# Patient Record
Sex: Female | Born: 1955
Health system: Southern US, Community
[De-identification: ages and names within clinical notes are randomized; demographics above are authoritative.]

## PROBLEM LIST (undated history)

## (undated) DIAGNOSIS — M1990 Unspecified osteoarthritis, unspecified site: Secondary | ICD-10-CM

## (undated) DIAGNOSIS — H269 Unspecified cataract: Secondary | ICD-10-CM

## (undated) DIAGNOSIS — R12 Heartburn: Secondary | ICD-10-CM

## (undated) DIAGNOSIS — K219 Gastro-esophageal reflux disease without esophagitis: Secondary | ICD-10-CM

## (undated) HISTORY — DX: Unspecified cataract: H26.9

## (undated) HISTORY — DX: Gastro-esophageal reflux disease without esophagitis: K21.9

## (undated) HISTORY — DX: Unspecified osteoarthritis, unspecified site: M19.90

## (undated) HISTORY — DX: Heartburn: R12

---

## 1975-08-02 HISTORY — PX: CYSTECTOMY: SUR359

## 2016-08-03 DIAGNOSIS — Z0189 Encounter for other specified special examinations: Secondary | ICD-10-CM | POA: Diagnosis not present

## 2016-08-03 DIAGNOSIS — J302 Other seasonal allergic rhinitis: Secondary | ICD-10-CM | POA: Diagnosis not present

## 2016-08-03 DIAGNOSIS — Z6832 Body mass index (BMI) 32.0-32.9, adult: Secondary | ICD-10-CM | POA: Diagnosis not present

## 2016-08-04 DIAGNOSIS — Z08 Encounter for follow-up examination after completed treatment for malignant neoplasm: Secondary | ICD-10-CM | POA: Diagnosis not present

## 2016-08-04 DIAGNOSIS — L570 Actinic keratosis: Secondary | ICD-10-CM | POA: Diagnosis not present

## 2016-08-04 DIAGNOSIS — X32XXXA Exposure to sunlight, initial encounter: Secondary | ICD-10-CM | POA: Diagnosis not present

## 2016-08-04 DIAGNOSIS — Z85828 Personal history of other malignant neoplasm of skin: Secondary | ICD-10-CM | POA: Diagnosis not present

## 2016-08-15 DIAGNOSIS — C44619 Basal cell carcinoma of skin of left upper limb, including shoulder: Secondary | ICD-10-CM | POA: Diagnosis not present

## 2016-08-15 DIAGNOSIS — Z1159 Encounter for screening for other viral diseases: Secondary | ICD-10-CM | POA: Diagnosis not present

## 2016-08-15 DIAGNOSIS — J302 Other seasonal allergic rhinitis: Secondary | ICD-10-CM | POA: Diagnosis not present

## 2016-08-15 DIAGNOSIS — Z0000 Encounter for general adult medical examination without abnormal findings: Secondary | ICD-10-CM | POA: Diagnosis not present

## 2016-08-15 DIAGNOSIS — M8580 Other specified disorders of bone density and structure, unspecified site: Secondary | ICD-10-CM | POA: Diagnosis not present

## 2016-09-13 ENCOUNTER — Ambulatory Visit (INDEPENDENT_AMBULATORY_CARE_PROVIDER_SITE_OTHER): Payer: BLUE CROSS/BLUE SHIELD

## 2016-09-13 ENCOUNTER — Ambulatory Visit (INDEPENDENT_AMBULATORY_CARE_PROVIDER_SITE_OTHER): Payer: BLUE CROSS/BLUE SHIELD | Admitting: Orthopaedic Surgery

## 2016-09-13 ENCOUNTER — Encounter: Payer: Self-pay | Admitting: Orthopaedic Surgery

## 2016-09-13 VITALS — BP 126/79 | HR 94 | Temp 97.9°F | Ht 63.0 in | Wt 181.0 lb

## 2016-09-13 DIAGNOSIS — M20011 Mallet finger of right finger(s): Secondary | ICD-10-CM

## 2016-09-13 NOTE — Progress Notes (Signed)
Subjective:    Patient ID: Heather Ali, female    DOB: 12-Jan-1956, 60 y.o.   MRN: [id number]  HPI She hurt her right long finger at the DIP joint about 10 weeks ago while removing stockings on the right foot.  She had pain.  She looked it up on Google and felt she had a mallet finger as she had a mallet deformity.  She ordered a splint on [employer] and had kept the finger in the splint for 8 weeks as the article on Google suggested.  She saw Dr. Delphina Cahill the other week and he suggested she come here.  She has no pain but the finger does not fully extend.   Review of Systems  HENT: Negative for congestion.   Respiratory: Negative for cough and shortness of breath.   Cardiovascular: Negative for chest pain and leg swelling.  Endocrine: Negative for cold intolerance.  Musculoskeletal: Positive for arthralgias and joint swelling.  Allergic/Immunologic: Negative for environmental allergies.   History reviewed. No pertinent past medical history.  Past Surgical History:  Procedure Laterality Date  . CYSTECTOMY  1977   OVARY    No current outpatient prescriptions on file prior to visit.   No current facility-administered medications on file prior to visit.     Social History   Social History  . Marital status: Married    Spouse name: N/A  . Number of children: N/A  . Years of education: N/A   Occupational History  . Not on file.   Social History Main Topics  . Smoking status: Never Smoker  . Smokeless tobacco: Never Used  . Alcohol use Not on file  . Drug use: Unknown  . Sexual activity: Not on file   Other Topics Concern  . Not on file   Social History Narrative  . No narrative on file    Family History  Problem Relation Age of Onset  . Hyperlipidemia Mother   . Diabetes Father   . Hypertension Father     BP 126/79   Pulse 94   Temp 97.9 F (36.6 C)   Ht 5\' 3"  (1.6 m)   Wt 181 lb (82.1 kg)   BMI 32.06 kg/m       Objective:   Physical Exam   Constitutional: She is oriented to person, place, and time. She appears well-developed and well-nourished.  HENT:  Head: Normocephalic and atraumatic.  Eyes: Conjunctivae and EOM are normal. Pupils are equal, round, and reactive to light.  Neck: Normal range of motion. Neck supple.  Cardiovascular: Normal rate, regular rhythm and intact distal pulses.   Pulmonary/Chest: Effort normal.  Abdominal: Soft.  Musculoskeletal: She exhibits tenderness (She has slight swelling of the DIP of the right long finger with about 10 to 12 degree lack of full extension of the finger joint.  NV intact.  Other fingers negative.).  Neurological: She is alert and oriented to person, place, and time. She displays normal reflexes. No cranial nerve deficit. She exhibits normal muscle tone. Coordination normal.  Skin: Skin is warm and dry.  Psychiatric: She has a normal mood and affect. Her behavior is normal. Judgment and thought content normal.  Vitals reviewed.   X-rays were done of the finger, reported separately.  There is no bony change.      Assessment & Plan:   Encounter Diagnosis  Name Primary?  . Mallet deformity of right middle finger Yes   I have gone over with her the findings and the diagnosis of  Mallet Finger.  She has done the proper therapy.  She will have an extension lag.  I have offered her to see a Copy.  I have told her about possible surgery.  She says she is doing well.  She will accept the lag.    I will see her as needed.  Call if any problem.  Electronically Signed Sanjuana Kava, MD 2/13/201812:04 PM

## 2017-03-01 DIAGNOSIS — N390 Urinary tract infection, site not specified: Secondary | ICD-10-CM | POA: Diagnosis not present

## 2017-03-01 DIAGNOSIS — Z6832 Body mass index (BMI) 32.0-32.9, adult: Secondary | ICD-10-CM | POA: Diagnosis not present

## 2017-06-25 DIAGNOSIS — J0110 Acute frontal sinusitis, unspecified: Secondary | ICD-10-CM | POA: Diagnosis not present

## 2017-08-20 DIAGNOSIS — J0110 Acute frontal sinusitis, unspecified: Secondary | ICD-10-CM | POA: Diagnosis not present

## 2017-08-21 DIAGNOSIS — J0110 Acute frontal sinusitis, unspecified: Secondary | ICD-10-CM | POA: Diagnosis not present

## 2017-08-21 DIAGNOSIS — J9801 Acute bronchospasm: Secondary | ICD-10-CM | POA: Diagnosis not present

## 2017-08-21 DIAGNOSIS — R0602 Shortness of breath: Secondary | ICD-10-CM | POA: Diagnosis not present

## 2017-08-28 DIAGNOSIS — J9801 Acute bronchospasm: Secondary | ICD-10-CM | POA: Diagnosis not present

## 2017-08-28 DIAGNOSIS — E782 Mixed hyperlipidemia: Secondary | ICD-10-CM | POA: Diagnosis not present

## 2017-08-28 DIAGNOSIS — R0602 Shortness of breath: Secondary | ICD-10-CM | POA: Diagnosis not present

## 2017-08-28 DIAGNOSIS — Z0001 Encounter for general adult medical examination with abnormal findings: Secondary | ICD-10-CM | POA: Diagnosis not present

## 2017-08-30 ENCOUNTER — Encounter (INDEPENDENT_AMBULATORY_CARE_PROVIDER_SITE_OTHER): Payer: Self-pay | Admitting: *Deleted

## 2017-10-21 DIAGNOSIS — N390 Urinary tract infection, site not specified: Secondary | ICD-10-CM | POA: Diagnosis not present

## 2017-11-27 DIAGNOSIS — N3289 Other specified disorders of bladder: Secondary | ICD-10-CM | POA: Diagnosis not present

## 2017-11-27 DIAGNOSIS — R102 Pelvic and perineal pain: Secondary | ICD-10-CM | POA: Diagnosis not present

## 2018-01-31 ENCOUNTER — Ambulatory Visit: Payer: BLUE CROSS/BLUE SHIELD | Admitting: Urology

## 2018-01-31 DIAGNOSIS — R102 Pelvic and perineal pain: Secondary | ICD-10-CM | POA: Diagnosis not present

## 2018-10-01 DIAGNOSIS — R05 Cough: Secondary | ICD-10-CM | POA: Diagnosis not present

## 2018-10-01 DIAGNOSIS — J09X2 Influenza due to identified novel influenza A virus with other respiratory manifestations: Secondary | ICD-10-CM | POA: Diagnosis not present

## 2018-10-01 DIAGNOSIS — J019 Acute sinusitis, unspecified: Secondary | ICD-10-CM | POA: Diagnosis not present

## 2018-12-13 DIAGNOSIS — Z0000 Encounter for general adult medical examination without abnormal findings: Secondary | ICD-10-CM | POA: Diagnosis not present

## 2018-12-25 DIAGNOSIS — Z1211 Encounter for screening for malignant neoplasm of colon: Secondary | ICD-10-CM | POA: Diagnosis not present

## 2018-12-25 DIAGNOSIS — Z1212 Encounter for screening for malignant neoplasm of rectum: Secondary | ICD-10-CM | POA: Diagnosis not present

## 2019-01-15 ENCOUNTER — Encounter: Payer: Self-pay | Admitting: Internal Medicine

## 2019-01-29 ENCOUNTER — Encounter: Payer: Self-pay | Admitting: Gastroenterology

## 2019-01-29 NOTE — Progress Notes (Signed)
Referring Provider: Dr. Nevada Crane Primary Care Physician:  Celene Squibb, MD Primary Gastroenterologist:  Dr. Gala Romney  Chief Complaint  Patient presents with  . +Cologuard    never had tcs    HPI:   Heather Ali is a 63 y.o. female presenting today at the request of Dr. Nevada Crane due to positive cologuard.   Has never had a colonoscopy due to concerns for her insurance not paying for it. No family history of colon cancer. Thinks brother had colon polyp. History of hemorrhoids since her first child. Usually feels hemorrhoid on outside, but doesn't usually bother her. Occasional tissue hematochezia, itching and burning if it gets irritated. Uses preparation H and this clears up. No constipation. Has BM every morning, soft, formed. No diarrhea. No melena. No abdominal pain.   Admits to acid reflux 3-4 times a week. Sugary foods and salads are triggers. Has started taking natural acid reflux medications daily which she states has helped. Doesn't like to take medications. Prefers not to take prescription or OTC medications, would rather take natural supplements.  Is sleeping with wedge, which has also helped. Not eating within 3 hours of laying down. Admits to worsening since COVID as she has gained 5 lbs. Husband passed in 2012. Lost 30 lbs, no reflux at that time. Kept off for 5 years, then put it back on. Trying to eat healthy now. Walking 2 miles 4 days a week. Denies epigastric pain, nausea, vomiting, and dysphagia.  Very rare ibuprofen.     Past Medical History:  Diagnosis Date  . Arthritis   . Cataract     Past Surgical History:  Procedure Laterality Date  . CYSTECTOMY  1977   OVARY    Current Outpatient Medications  Medication Sig Dispense Refill  . fexofenadine (ALLEGRA) 60 MG tablet Take 60 mg by mouth as needed for allergies or rhinitis.    Marland Kitchen CLENPIQ 10-3.5-12 MG-GM -GM/160ML SOLN Take 1 kit by mouth once for 1 dose. 320 mL 0   No current facility-administered medications for  this visit.     Allergies as of 01/30/2019 - Review Complete 01/30/2019  Allergen Reaction Noted  . Sulfa antibiotics  09/13/2016    Family History  Problem Relation Age of Onset  . Hyperlipidemia Mother   . Diabetes Father   . Hypertension Father   . Colon polyps Brother   . Colon cancer Neg Hx     Social History   Socioeconomic History  . Marital status: Widowed    Spouse name: Not on file  . Number of children: Not on file  . Years of education: Not on file  . Highest education level: Not on file  Occupational History  . Occupation: Pharmacist, hospital   Social Needs  . Financial resource strain: Not on file  . Food insecurity    Worry: Not on file    Inability: Not on file  . Transportation needs    Medical: Not on file    Non-medical: Not on file  Tobacco Use  . Smoking status: Never Smoker  . Smokeless tobacco: Never Used  Substance and Sexual Activity  . Alcohol use: Never    Frequency: Never  . Drug use: Never  . Sexual activity: Not on file  Lifestyle  . Physical activity    Days per week: Not on file    Minutes per session: Not on file  . Stress: Not on file  Relationships  . Social Herbalist on phone: Not  on file    Gets together: Not on file    Attends religious service: Not on file    Active member of club or organization: Not on file    Attends meetings of clubs or organizations: Not on file    Relationship status: Not on file  . Intimate partner violence    Fear of current or ex partner: Not on file    Emotionally abused: Not on file    Physically abused: Not on file    Forced sexual activity: Not on file  Other Topics Concern  . Not on file  Social History Narrative  . Not on file    Review of Systems: Gen: Denies any fever, chills, fatigue, weight loss, lack of appetite.  CV: Denies chest pain, heart palpitations, peripheral edema, syncope.  Resp: Denies shortness of breath at rest or with exertion. Admits to cough when she has  reflux.  GI: See HPI GU : Denies urinary burning, urinary frequency, urinary hesitancy MS: Denies joint pain, muscle weakness, cramps, or limitation of movement.  Derm: Denies rash, itching, dry skin Psych: Denies depression, anxiety Heme: Denies bruising, bleeding  Physical Exam: BP 137/77   Pulse 93   Temp 97.8 F (36.6 C) (Oral)   Ht '5\' 3"'$  (1.6 m)   Wt 188 lb 9.6 oz (85.5 kg)   BMI 33.41 kg/m  General:   Alert and oriented. Pleasant and cooperative. Well-nourished and well-developed.  Head:  Normocephalic and atraumatic. Eyes:  Without icterus, sclera clear and conjunctiva pink.  Ears:  Normal auditory acuity. Nose:  No deformity, discharge,  or lesions. Lungs:  Clear to auscultation bilaterally. No wheezes, rales, or rhonchi. No distress.  Heart:  S1, S2 present without murmurs appreciated.  Abdomen:  +BS, soft, non-tender and non-distended. No HSM noted. No guarding or rebound. No masses appreciated.  Rectal:  Deferred  Msk:  Symmetrical without gross deformities. Normal posture. Pulses:  Normal pulses noted. Extremities:  Without clubbing or edema. Neurologic:  Alert and  oriented x4;  grossly normal neurologically. Skin:  Intact without significant lesions or rashes. Psych:  Alert and cooperative. Normal mood and affect.

## 2019-01-29 NOTE — H&P (View-Only) (Signed)
Referring Provider: Dr. Nevada Crane Primary Care Physician:  Celene Squibb, MD Primary Gastroenterologist:  Dr. Gala Romney  Chief Complaint  Patient presents with  . +Cologuard    never had tcs    HPI:   Heather Ali is a 63 y.o. female presenting today at the request of Dr. Nevada Crane due to positive cologuard.   Has never had a colonoscopy due to concerns for her insurance not paying for it. No family history of colon cancer. Thinks brother had colon polyp. History of hemorrhoids since her first child. Usually feels hemorrhoid on outside, but doesn't usually bother her. Occasional tissue hematochezia, itching and burning if it gets irritated. Uses preparation H and this clears up. No constipation. Has BM every morning, soft, formed. No diarrhea. No melena. No abdominal pain.   Admits to acid reflux 3-4 times a week. Sugary foods and salads are triggers. Has started taking natural acid reflux medications daily which she states has helped. Doesn't like to take medications. Prefers not to take prescription or OTC medications, would rather take natural supplements.  Is sleeping with wedge, which has also helped. Not eating within 3 hours of laying down. Admits to worsening since COVID as she has gained 5 lbs. Husband passed in 2012. Lost 30 lbs, no reflux at that time. Kept off for 5 years, then put it back on. Trying to eat healthy now. Walking 2 miles 4 days a week. Denies epigastric pain, nausea, vomiting, and dysphagia.  Very rare ibuprofen.     Past Medical History:  Diagnosis Date  . Arthritis   . Cataract     Past Surgical History:  Procedure Laterality Date  . CYSTECTOMY  1977   OVARY    Current Outpatient Medications  Medication Sig Dispense Refill  . fexofenadine (ALLEGRA) 60 MG tablet Take 60 mg by mouth as needed for allergies or rhinitis.    Marland Kitchen CLENPIQ 10-3.5-12 MG-GM -GM/160ML SOLN Take 1 kit by mouth once for 1 dose. 320 mL 0   No current facility-administered medications for  this visit.     Allergies as of 01/30/2019 - Review Complete 01/30/2019  Allergen Reaction Noted  . Sulfa antibiotics  09/13/2016    Family History  Problem Relation Age of Onset  . Hyperlipidemia Mother   . Diabetes Father   . Hypertension Father   . Colon polyps Brother   . Colon cancer Neg Hx     Social History   Socioeconomic History  . Marital status: Widowed    Spouse name: Not on file  . Number of children: Not on file  . Years of education: Not on file  . Highest education level: Not on file  Occupational History  . Occupation: Pharmacist, hospital   Social Needs  . Financial resource strain: Not on file  . Food insecurity    Worry: Not on file    Inability: Not on file  . Transportation needs    Medical: Not on file    Non-medical: Not on file  Tobacco Use  . Smoking status: Never Smoker  . Smokeless tobacco: Never Used  Substance and Sexual Activity  . Alcohol use: Never    Frequency: Never  . Drug use: Never  . Sexual activity: Not on file  Lifestyle  . Physical activity    Days per week: Not on file    Minutes per session: Not on file  . Stress: Not on file  Relationships  . Social Herbalist on phone: Not  on file    Gets together: Not on file    Attends religious service: Not on file    Active member of club or organization: Not on file    Attends meetings of clubs or organizations: Not on file    Relationship status: Not on file  . Intimate partner violence    Fear of current or ex partner: Not on file    Emotionally abused: Not on file    Physically abused: Not on file    Forced sexual activity: Not on file  Other Topics Concern  . Not on file  Social History Narrative  . Not on file    Review of Systems: Gen: Denies any fever, chills, fatigue, weight loss, lack of appetite.  CV: Denies chest pain, heart palpitations, peripheral edema, syncope.  Resp: Denies shortness of breath at rest or with exertion. Admits to cough when she has  reflux.  GI: See HPI GU : Denies urinary burning, urinary frequency, urinary hesitancy MS: Denies joint pain, muscle weakness, cramps, or limitation of movement.  Derm: Denies rash, itching, dry skin Psych: Denies depression, anxiety Heme: Denies bruising, bleeding  Physical Exam: BP 137/77   Pulse 93   Temp 97.8 F (36.6 C) (Oral)   Ht '5\' 3"'$  (1.6 m)   Wt 188 lb 9.6 oz (85.5 kg)   BMI 33.41 kg/m  General:   Alert and oriented. Pleasant and cooperative. Well-nourished and well-developed.  Head:  Normocephalic and atraumatic. Eyes:  Without icterus, sclera clear and conjunctiva pink.  Ears:  Normal auditory acuity. Nose:  No deformity, discharge,  or lesions. Lungs:  Clear to auscultation bilaterally. No wheezes, rales, or rhonchi. No distress.  Heart:  S1, S2 present without murmurs appreciated.  Abdomen:  +BS, soft, non-tender and non-distended. No HSM noted. No guarding or rebound. No masses appreciated.  Rectal:  Deferred  Msk:  Symmetrical without gross deformities. Normal posture. Pulses:  Normal pulses noted. Extremities:  Without clubbing or edema. Neurologic:  Alert and  oriented x4;  grossly normal neurologically. Skin:  Intact without significant lesions or rashes. Psych:  Alert and cooperative. Normal mood and affect.

## 2019-01-30 ENCOUNTER — Other Ambulatory Visit: Payer: Self-pay

## 2019-01-30 ENCOUNTER — Encounter: Payer: Self-pay | Admitting: *Deleted

## 2019-01-30 ENCOUNTER — Ambulatory Visit (INDEPENDENT_AMBULATORY_CARE_PROVIDER_SITE_OTHER): Payer: Medicare Other | Admitting: Gastroenterology

## 2019-01-30 ENCOUNTER — Encounter: Payer: Self-pay | Admitting: Gastroenterology

## 2019-01-30 ENCOUNTER — Other Ambulatory Visit: Payer: Self-pay | Admitting: *Deleted

## 2019-01-30 ENCOUNTER — Encounter: Payer: Self-pay | Admitting: Internal Medicine

## 2019-01-30 DIAGNOSIS — R195 Other fecal abnormalities: Secondary | ICD-10-CM

## 2019-01-30 DIAGNOSIS — K219 Gastro-esophageal reflux disease without esophagitis: Secondary | ICD-10-CM | POA: Insufficient documentation

## 2019-01-30 MED ORDER — CLENPIQ 10-3.5-12 MG-GM -GM/160ML PO SOLN: 1.0000 | Freq: Once | ORAL | 0 refills | Status: AC

## 2019-01-30 NOTE — Patient Instructions (Addendum)
1. We will get you scheduled for colonoscopy in the near future with Dr. Gala Romney.   2. Continue weight loss efforts, avoiding GERD triggers, not laying down within 3 hours of meals, and sleeping with head of bed elevated.   3. We will see you back in 3 months to follow up on GERD symptoms and consider starting therapy at that time.       Gastroesophageal Reflux Disease, Adult Gastroesophageal reflux (GER) happens when acid from the stomach flows up into the tube that connects the mouth and the stomach (esophagus). Normally, food travels down the esophagus and stays in the stomach to be digested. With GER, food and stomach acid sometimes move back up into the esophagus. You may have a disease called gastroesophageal reflux disease (GERD) if the reflux:  Happens often.  Causes frequent or very bad symptoms.  Causes problems such as damage to the esophagus. When this happens, the esophagus becomes sore and swollen (inflamed). Over time, GERD can make small holes (ulcers) in the lining of the esophagus. What are the causes? This condition is caused by a problem with the muscle between the esophagus and the stomach. When this muscle is weak or not normal, it does not close properly to keep food and acid from coming back up from the stomach. The muscle can be weak because of:  Tobacco use.  Pregnancy.  Having a certain type of hernia (hiatal hernia).  Alcohol use.  Certain foods and drinks, such as coffee, chocolate, onions, and peppermint. What increases the risk? You are more likely to develop this condition if you:  Are overweight.  Have a disease that affects your connective tissue.  Use NSAID medicines. What are the signs or symptoms? Symptoms of this condition include:  Heartburn.  Difficult or painful swallowing.  The feeling of having a lump in the throat.  A bitter taste in the mouth.  Bad breath.  Having a lot of saliva.  Having an upset or bloated  stomach.  Belching.  Chest pain. Different conditions can cause chest pain. Make sure you see your doctor if you have chest pain.  Shortness of breath or noisy breathing (wheezing).  Ongoing (chronic) cough or a cough at night.  Wearing away of the surface of teeth (tooth enamel).  Weight loss. How is this treated? Treatment will depend on how bad your symptoms are. Your doctor may suggest:  Changes to your diet.  Medicine.  Surgery. Follow these instructions at home: Eating and drinking   Follow a diet as told by your doctor. You may need to avoid foods and drinks such as: ? Coffee and tea (with or without caffeine). ? Drinks that contain alcohol. ? Energy drinks and sports drinks. ? Bubbly (carbonated) drinks or sodas. ? Chocolate and cocoa. ? Peppermint and mint flavorings. ? Garlic and onions. ? Horseradish. ? Spicy and acidic foods. These include peppers, chili powder, curry powder, vinegar, hot sauces, and BBQ sauce. ? Citrus fruit juices and citrus fruits, such as oranges, lemons, and limes. ? Tomato-based foods. These include red sauce, chili, salsa, and pizza with red sauce. ? Fried and fatty foods. These include donuts, french fries, potato chips, and high-fat dressings. ? High-fat meats. These include hot dogs, rib eye steak, sausage, ham, and bacon. ? High-fat dairy items, such as whole milk, butter, and cream cheese.  Eat small meals often. Avoid eating large meals.  Avoid drinking large amounts of liquid with your meals.  Avoid eating meals during the 2-3  hours before bedtime.  Avoid lying down right after you eat.  Do not exercise right after you eat. Lifestyle   Do not use any products that contain nicotine or tobacco. These include cigarettes, e-cigarettes, and chewing tobacco. If you need help quitting, ask your doctor.  Try to lower your stress. If you need help doing this, ask your doctor.  If you are overweight, lose an amount of weight  that is healthy for you. Ask your doctor about a safe weight loss goal. General instructions  Pay attention to any changes in your symptoms.  Take over-the-counter and prescription medicines only as told by your doctor. Do not take aspirin, ibuprofen, or other NSAIDs unless your doctor says it is okay.  Wear loose clothes. Do not wear anything tight around your waist.  Raise (elevate) the head of your bed about 6 inches (15 cm).  Avoid bending over if this makes your symptoms worse.  Keep all follow-up visits as told by your doctor. This is important. Contact a doctor if:  You have new symptoms.  You lose weight and you do not know why.  You have trouble swallowing or it hurts to swallow.  You have wheezing or a cough that keeps happening.  Your symptoms do not get better with treatment.  You have a hoarse voice. Get help right away if:  You have pain in your arms, neck, jaw, teeth, or back.  You feel sweaty, dizzy, or light-headed.  You have chest pain or shortness of breath.  You throw up (vomit) and your throw-up looks like blood or coffee grounds.  You pass out (faint).  Your poop (stool) is bloody or black.  You cannot swallow, drink, or eat. Summary  If a person has gastroesophageal reflux disease (GERD), food and stomach acid move back up into the esophagus and cause symptoms or problems such as damage to the esophagus.  Treatment will depend on how bad your symptoms are.  Follow a diet as told by your doctor.  Take all medicines only as told by your doctor. This information is not intended to replace advice given to you by your health care provider. Make sure you discuss any questions you have with your health care provider. Document Released: 01/04/2008 Document Revised: 01/24/2018 Document Reviewed: 01/24/2018 Elsevier Patient Education  2020 [employer].

## 2019-01-30 NOTE — Assessment & Plan Note (Addendum)
Patient with symptoms of GERD at this time worsening with recent weight gain. Was occurring 3-4 times a week. Sugary foods and salads are identified triggers. Started taking natural antacid medications which she states have helped. Patient prefers not to take prescription medications. Currently trying to eat healthy and lose weight as she knows weight gain has worsened symptoms. No epigastric pain, nausea, vomiting, or dysphagia. No melena or brbpr.   Discussed starting patient on daily PPI as symptoms are frequent; however, patient prefers to continue trying her natural medications a little longer and her weight loss efforts before starting a prescription or other OTC medication.  Will follow-up with patient in 3 months for GERD and consider starting a daily PPI if she is still having frequent symptoms.  Encouraged to continue healthy lifestyle including healthy diet and exercise and weight loss efforts.  Avoid laying down within 3 hours of eating. Continue using wedge for sleeping.  GERD handout provided.

## 2019-01-30 NOTE — Assessment & Plan Note (Addendum)
63 y.o. female with no significant past medical history presenting after positive cologuard on Dec 30, 2018. Patient has never had a colonoscopy due to concerns about cost. Hemorrhoids since first child that flare occasionally with associated tissue hematochezia, uses preparation H which works well. BMs regular, no other brbpr or melena. No alarm symptoms. No family history of colon cancer. Thinks brother may have had a colon polyp. Rare ibuprofen. No alcohol use.   Proceed with TCS with Dr. Gala Romney in the near future. The risks, benefits, and alternatives have been discussed in detail with patient. They have stated understanding and desire to proceed. Conscious sedation should be fine for this patient. No antidepressants, anxiolytics, pain medication, alcohol, or drug use.

## 2019-01-31 NOTE — Progress Notes (Signed)
CC'D TO PCP °

## 2019-02-14 DIAGNOSIS — Z1231 Encounter for screening mammogram for malignant neoplasm of breast: Secondary | ICD-10-CM | POA: Diagnosis not present

## 2019-02-22 ENCOUNTER — Other Ambulatory Visit (HOSPITAL_COMMUNITY)
Admission: RE | Admit: 2019-02-22 | Discharge: 2019-02-22 | Disposition: A | Discharge status: Home / Self Care | Payer: Medicare Other | Source: Ambulatory Visit | Attending: Internal Medicine | Admitting: Internal Medicine

## 2019-02-22 ENCOUNTER — Other Ambulatory Visit: Payer: Self-pay

## 2019-02-22 DIAGNOSIS — Z1159 Encounter for screening for other viral diseases: Secondary | ICD-10-CM | POA: Insufficient documentation

## 2019-02-23 LAB — SARS CORONAVIRUS 2 (TAT 6-24 HRS): SARS Coronavirus 2: NEGATIVE

## 2019-02-27 ENCOUNTER — Encounter (HOSPITAL_COMMUNITY)
Admission: RE | Disposition: A | Discharge status: Home / Self Care | Payer: Self-pay | Source: Home / Self Care | Attending: Internal Medicine

## 2019-02-27 ENCOUNTER — Ambulatory Visit (HOSPITAL_COMMUNITY)
Admission: RE | Admit: 2019-02-27 | Discharge: 2019-02-27 | Disposition: A | Discharge status: Home / Self Care | Payer: Medicare Other | Attending: Internal Medicine | Admitting: Internal Medicine

## 2019-02-27 ENCOUNTER — Encounter (HOSPITAL_COMMUNITY): Payer: Self-pay | Admitting: *Deleted

## 2019-02-27 DIAGNOSIS — K635 Polyp of colon: Secondary | ICD-10-CM

## 2019-02-27 DIAGNOSIS — R195 Other fecal abnormalities: Secondary | ICD-10-CM

## 2019-02-27 DIAGNOSIS — M1990 Unspecified osteoarthritis, unspecified site: Secondary | ICD-10-CM | POA: Diagnosis not present

## 2019-02-27 DIAGNOSIS — K219 Gastro-esophageal reflux disease without esophagitis: Secondary | ICD-10-CM | POA: Diagnosis not present

## 2019-02-27 HISTORY — PX: POLYPECTOMY: SHX5525

## 2019-02-27 HISTORY — PX: COLONOSCOPY: SHX5424

## 2019-02-27 SURGERY — COLONOSCOPY
Anesthesia: Moderate Sedation

## 2019-02-27 MED ORDER — ONDANSETRON HCL 4 MG/2ML IJ SOLN
INTRAMUSCULAR | Status: AC
  Filled 2019-02-27: qty 2

## 2019-02-27 MED ORDER — MEPERIDINE HCL 100 MG/ML IJ SOLN
INTRAMUSCULAR | Status: DC | PRN
  Administered 2019-02-27: 25 mg
  Administered 2019-02-27: 15 mg via INTRAVENOUS

## 2019-02-27 MED ORDER — MIDAZOLAM HCL 5 MG/5ML IJ SOLN
INTRAMUSCULAR | Status: DC | PRN
  Administered 2019-02-27: 2 mg via INTRAVENOUS
  Administered 2019-02-27 (×2): 1 mg via INTRAVENOUS
  Administered 2019-02-27: 2 mg via INTRAVENOUS
  Administered 2019-02-27: 1 mg via INTRAVENOUS

## 2019-02-27 MED ORDER — MEPERIDINE HCL 50 MG/ML IJ SOLN
INTRAMUSCULAR | Status: AC
  Filled 2019-02-27: qty 1

## 2019-02-27 MED ORDER — SODIUM CHLORIDE 0.9 % IV SOLN
INTRAVENOUS | Status: DC
  Administered 2019-02-27: 10:00:00 via INTRAVENOUS

## 2019-02-27 MED ORDER — ONDANSETRON HCL 4 MG/2ML IJ SOLN
INTRAMUSCULAR | Status: DC | PRN
  Administered 2019-02-27: 4 mg via INTRAVENOUS

## 2019-02-27 MED ORDER — MIDAZOLAM HCL 5 MG/5ML IJ SOLN
INTRAMUSCULAR | Status: AC
  Filled 2019-02-27: qty 10

## 2019-02-27 NOTE — Op Note (Signed)
[facility] Patient Name: Heather Ali Procedure Date: 02/27/2019 10:08 AM MRN: [id number] Date of Birth: 1956-04-12 Attending MD: Norvel Richards , MD CSN: [id number] Age: 63 Admit Type: Outpatient Procedure:                Colonoscopy Indications:              Positive Cologuard test Providers:                Norvel Richards, MD, Janeece Riggers, RN, Lurline Del, RN, Nelma Rothman, Technician Referring MD:              Medicines:                Midazolam 7 mg IV, Meperidine 40 mg IV Complications:            No immediate complications. Estimated Blood Loss:     Estimated blood loss was minimal. Procedure:                Pre-Anesthesia Assessment:                           - Prior to the procedure, a History and Physical                            was performed, and patient medications and                            allergies were reviewed. The patient's tolerance of                            previous anesthesia was also reviewed. The risks                            and benefits of the procedure and the sedation                            options and risks were discussed with the patient.                            All questions were answered, and informed consent                            was obtained. Prior Anticoagulants: The patient has                            taken no previous anticoagulant or antiplatelet                            agents. ASA Grade Assessment: II - A patient with                            mild systemic disease. After reviewing the risks  and benefits, the patient was deemed in                            satisfactory condition to undergo the procedure.                           After obtaining informed consent, the colonoscope                            was passed under direct vision. Throughout the                            procedure, the patient's blood pressure, pulse, and           oxygen saturations were monitored continuously. The                            CF-HQ190L ([id number]) scope was introduced through                            the anus and advanced to the the cecum, identified                            by appendiceal orifice and ileocecal valve. The                            colonoscopy was performed without difficulty. The                            patient tolerated the procedure well. The quality                            of the bowel preparation was adequate. Scope In: 10:18:22 AM Scope Out: 10:35:34 AM Scope Withdrawal Time: 0 hours 11 minutes 10 seconds  Total Procedure Duration: 0 hours 17 minutes 12 seconds  Findings:      The perianal and digital rectal examinations were normal. Miinimal       hemorrhoids      Two sessile polyps were found in the ascending colon. The polyps were 3       to 4 mm in size. These polyps were removed with a cold snare. Resection       and retrieval were complete. Estimated blood loss was minimal.      The exam was otherwise without abnormality on direct and retroflexion       views. Impression:               - Two 3 to 4 mm polyps in the ascending colon,                            removed with a cold snare. Resected and retrieved.                           - The examination was otherwise normal on direct  and retroflexion views. Moderate Sedation:      Moderate (conscious) sedation was administered by the endoscopy nurse       and supervised by the endoscopist. The following parameters were       monitored: oxygen saturation, heart rate, blood pressure, respiratory       rate, EKG, adequacy of pulmonary ventilation, and response to care.       Total physician intraservice time was 25 minutes. Recommendation:           - Patient has a contact number available for                            emergencies. The signs and symptoms of potential                            delayed  complications were discussed with the                            patient. Return to normal activities tomorrow.                            Written discharge instructions were provided to the                            patient.                           - Resume previous diet.                           - Continue present medications.                           - Repeat colonoscopy date to be determined after                            pending pathology results are reviewed for                            surveillance based on pathology results.                           - Return to GI office (date not yet determined). Procedure Code(s):        --- Professional ---                           [phone], Colonoscopy, flexible; with removal of                            tumor(s), polyp(s), or other lesion(s) by snare                            technique                           99153, Moderate sedation; each additional 15  minutes intraservice time                           G0500, Moderate sedation services provided by the                            same physician or other qualified health care                            professional performing a gastrointestinal                            endoscopic service that sedation supports,                            requiring the presence of an independent trained                            observer to assist in the monitoring of the                            patient's level of consciousness and physiological                            status; initial 15 minutes of intra-service time;                            patient age 69 years or older (additional time may                            be reported with [phone], as appropriate) Diagnosis Code(s):        --- Professional ---                           K63.5, Polyp of colon                           R19.5, Other fecal abnormalities CPT copyright 2019 American Medical Association. All  rights reserved. The codes documented in this report are preliminary and upon coder review may  be revised to meet current compliance requirements. Cristopher Estimable. Kamaree Berkel, MD Norvel Richards, MD 02/27/2019 10:43:18 AM This report has been signed electronically. Number of Addenda: 0

## 2019-02-27 NOTE — Discharge Instructions (Signed)
Colonoscopy Discharge Instructions  Read the instructions outlined below and refer to this sheet in the next few weeks. These discharge instructions provide you with general information on caring for yourself after you leave the hospital. Your doctor may also give you specific instructions. While your treatment has been planned according to the most current medical practices available, unavoidable complications occasionally occur. If you have any problems or questions after discharge, call Dr. Gala Romney at [phone]. ACTIVITY  You may resume your regular activity, but move at a slower pace for the next 24 hours.   Take frequent rest periods for the next 24 hours.   Walking will help get rid of the air and reduce the bloated feeling in your belly (abdomen).   No driving for 24 hours (because of the medicine (anesthesia) used during the test).    Do not sign any important legal documents or operate any machinery for 24 hours (because of the anesthesia used during the test).  NUTRITION  Drink plenty of fluids.   You may resume your normal diet as instructed by your doctor.   Begin with a light meal and progress to your normal diet. Heavy or fried foods are harder to digest and may make you feel sick to your stomach (nauseated).   Avoid alcoholic beverages for 24 hours or as instructed.  MEDICATIONS  You may resume your normal medications unless your doctor tells you otherwise.  WHAT YOU CAN EXPECT TODAY  Some feelings of bloating in the abdomen.   Passage of more gas than usual.   Spotting of blood in your stool or on the toilet paper.  IF YOU HAD POLYPS REMOVED DURING THE COLONOSCOPY:  No aspirin products for 7 days or as instructed.   No alcohol for 7 days or as instructed.   Eat a soft diet for the next 24 hours.  FINDING OUT THE RESULTS OF YOUR TEST Not all test results are available during your visit. If your test results are not back during the visit, make an appointment  with your caregiver to find out the results. Do not assume everything is normal if you have not heard from your caregiver or the medical facility. It is important for you to follow up on all of your test results.  SEEK IMMEDIATE MEDICAL ATTENTION IF:  You have more than a spotting of blood in your stool.   Your belly is swollen (abdominal distention).   You are nauseated or vomiting.   You have a temperature over 101.   You have abdominal pain or discomfort that is severe or gets worse throughout the day.    Colon polyp information provided  Further recommendations to follow pending review of pathology report  I discussed my findings and recommendations with Corrinne Eagle at [phone]     Colon Polyps  Polyps are tissue growths inside the body. Polyps can grow in many places, including the large intestine (colon). A polyp may be a round bump or a mushroom-shaped growth. You could have one polyp or several. Most colon polyps are noncancerous (benign). However, some colon polyps can become cancerous over time. Finding and removing the polyps early can help prevent this. What are the causes? The exact cause of colon polyps is not known. What increases the risk? You are more likely to develop this condition if you:  Have a family history of colon cancer or colon polyps.  Are older than 67 or older than 45 if you are African American.  Have inflammatory bowel  disease, such as ulcerative colitis or Crohn's disease.  Have certain hereditary conditions, such as: ? Familial adenomatous polyposis. ? Lynch syndrome. ? Turcot syndrome. ? Peutz-Jeghers syndrome.  Are overweight.  Smoke cigarettes.  Do not get enough exercise.  Drink too much alcohol.  Eat a diet that is high in fat and red meat and low in fiber.  Had childhood cancer that was treated with abdominal radiation. What are the signs or symptoms? Most polyps do not cause symptoms. If you have symptoms, they may  include:  Blood coming from your rectum when having a bowel movement.  Blood in your stool. The stool may look dark red or black.  Abdominal pain.  A change in bowel habits, such as constipation or diarrhea. How is this diagnosed? This condition is diagnosed with a colonoscopy. This is a procedure in which a lighted, flexible scope is inserted into the anus and then passed into the colon to examine the area. Polyps are sometimes found when a colonoscopy is done as part of routine cancer screening tests. How is this treated? Treatment for this condition involves removing any polyps that are found. Most polyps can be removed during a colonoscopy. Those polyps will then be tested for cancer. Additional treatment may be needed depending on the results of testing. Follow these instructions at home: Lifestyle  Maintain a healthy weight, or lose weight if recommended by your health care provider.  Exercise every day or as told by your health care provider.  Do not use any products that contain nicotine or tobacco, such as cigarettes and e-cigarettes. If you need help quitting, ask your health care provider.  If you drink alcohol, limit how much you have: ? 0-1 drink a day for women. ? 0-2 drinks a day for men.  Be aware of how much alcohol is in your drink. In the [place] one drink equals one 12 oz bottle of beer (355 mL), one 5 oz glass of wine (148 mL), or one 1 oz shot of hard liquor (44 mL). Eating and drinking   Eat foods that are high in fiber, such as fruits, vegetables, and whole grains.  Eat foods that are high in calcium and vitamin D, such as milk, cheese, yogurt, eggs, liver, fish, and broccoli.  Limit foods that are high in fat, such as fried foods and desserts.  Limit the amount of red meat and processed meat you eat, such as hot dogs, sausage, bacon, and lunch meats. General instructions  Keep all follow-up visits as told by your health care provider. This is  important. ? This includes having regularly scheduled colonoscopies. ? Talk to your health care provider about when you need a colonoscopy. Contact a health care provider if:  You have new or worsening bleeding during a bowel movement.  You have new or increased blood in your stool.  You have a change in bowel habits.  You lose weight for no known reason. Summary  Polyps are tissue growths inside the body. Polyps can grow in many places, including the colon.  Most colon polyps are noncancerous (benign), but some can become cancerous over time.  This condition is diagnosed with a colonoscopy.  Treatment for this condition involves removing any polyps that are found. Most polyps can be removed during a colonoscopy. This information is not intended to replace advice given to you by your health care provider. Make sure you discuss any questions you have with your health care provider. Document Released: 04/13/2004 Document Revised: 11/02/2017  Document Reviewed: 11/02/2017 Elsevier Patient Education  [employer].

## 2019-02-27 NOTE — Interval H&P Note (Signed)
History and Physical Interval Note:  02/27/2019 10:03 AM  Heather Ali  has presented today for surgery, with the diagnosis of + cologuard.  The various methods of treatment have been discussed with the patient and family. After consideration of risks, benefits and other options for treatment, the patient has consented to  Procedure(s) with comments: COLONOSCOPY (N/A) - 10:30am as a surgical intervention.  The patient's history has been reviewed, patient examined, no change in status, stable for surgery.  I have reviewed the patient's chart and labs.  Questions were answered to the patient's satisfaction.     Heather Ali  No change.  Diagnostic colonoscopy per plan.  The risks, benefits, limitations, alternatives and imponderables have been reviewed with the patient. Questions have been answered. All parties are agreeable.

## 2019-03-02 ENCOUNTER — Encounter: Payer: Self-pay | Admitting: Internal Medicine

## 2019-03-13 ENCOUNTER — Encounter (HOSPITAL_COMMUNITY): Payer: Self-pay | Admitting: Internal Medicine

## 2019-05-02 ENCOUNTER — Ambulatory Visit: Payer: Medicare Other | Admitting: Gastroenterology

## 2019-08-28 DIAGNOSIS — E782 Mixed hyperlipidemia: Secondary | ICD-10-CM | POA: Diagnosis not present

## 2019-09-03 ENCOUNTER — Other Ambulatory Visit (HOSPITAL_COMMUNITY): Payer: Self-pay | Admitting: Internal Medicine

## 2019-09-03 DIAGNOSIS — Z0001 Encounter for general adult medical examination with abnormal findings: Secondary | ICD-10-CM | POA: Diagnosis not present

## 2019-09-03 DIAGNOSIS — Z13820 Encounter for screening for osteoporosis: Secondary | ICD-10-CM

## 2019-09-03 DIAGNOSIS — H9193 Unspecified hearing loss, bilateral: Secondary | ICD-10-CM | POA: Diagnosis not present

## 2019-09-03 DIAGNOSIS — E782 Mixed hyperlipidemia: Secondary | ICD-10-CM | POA: Diagnosis not present

## 2019-09-03 DIAGNOSIS — M8580 Other specified disorders of bone density and structure, unspecified site: Secondary | ICD-10-CM | POA: Diagnosis not present

## 2019-09-18 DIAGNOSIS — H93293 Other abnormal auditory perceptions, bilateral: Secondary | ICD-10-CM | POA: Diagnosis not present

## 2019-12-18 DIAGNOSIS — J019 Acute sinusitis, unspecified: Secondary | ICD-10-CM | POA: Diagnosis not present

## 2020-03-02 DIAGNOSIS — N3289 Other specified disorders of bladder: Secondary | ICD-10-CM | POA: Diagnosis not present

## 2020-03-02 DIAGNOSIS — E782 Mixed hyperlipidemia: Secondary | ICD-10-CM | POA: Diagnosis not present

## 2020-03-02 DIAGNOSIS — R05 Cough: Secondary | ICD-10-CM | POA: Diagnosis not present

## 2020-03-02 DIAGNOSIS — N390 Urinary tract infection, site not specified: Secondary | ICD-10-CM | POA: Diagnosis not present

## 2020-03-05 DIAGNOSIS — Z87442 Personal history of urinary calculi: Secondary | ICD-10-CM | POA: Diagnosis not present

## 2020-03-05 DIAGNOSIS — Z85828 Personal history of other malignant neoplasm of skin: Secondary | ICD-10-CM | POA: Diagnosis not present

## 2020-03-05 DIAGNOSIS — E782 Mixed hyperlipidemia: Secondary | ICD-10-CM | POA: Diagnosis not present

## 2020-03-05 DIAGNOSIS — M8580 Other specified disorders of bone density and structure, unspecified site: Secondary | ICD-10-CM | POA: Diagnosis not present

## 2020-03-10 ENCOUNTER — Other Ambulatory Visit: Payer: Self-pay

## 2020-03-10 ENCOUNTER — Ambulatory Visit (HOSPITAL_COMMUNITY)
Admission: RE | Admit: 2020-03-10 | Discharge: 2020-03-10 | Disposition: A | Discharge status: Home / Self Care | Payer: Medicare Other | Source: Ambulatory Visit | Attending: Internal Medicine | Admitting: Internal Medicine

## 2020-03-10 DIAGNOSIS — Z780 Asymptomatic menopausal state: Secondary | ICD-10-CM | POA: Insufficient documentation

## 2020-03-10 DIAGNOSIS — M810 Age-related osteoporosis without current pathological fracture: Secondary | ICD-10-CM | POA: Insufficient documentation

## 2020-03-10 DIAGNOSIS — Z13820 Encounter for screening for osteoporosis: Secondary | ICD-10-CM | POA: Insufficient documentation

## 2020-06-08 DIAGNOSIS — M47816 Spondylosis without myelopathy or radiculopathy, lumbar region: Secondary | ICD-10-CM | POA: Diagnosis not present

## 2020-06-08 DIAGNOSIS — M9903 Segmental and somatic dysfunction of lumbar region: Secondary | ICD-10-CM | POA: Diagnosis not present

## 2020-06-11 DIAGNOSIS — M9903 Segmental and somatic dysfunction of lumbar region: Secondary | ICD-10-CM | POA: Diagnosis not present

## 2020-06-11 DIAGNOSIS — M47816 Spondylosis without myelopathy or radiculopathy, lumbar region: Secondary | ICD-10-CM | POA: Diagnosis not present

## 2020-06-15 DIAGNOSIS — M47816 Spondylosis without myelopathy or radiculopathy, lumbar region: Secondary | ICD-10-CM | POA: Diagnosis not present

## 2020-06-15 DIAGNOSIS — M9903 Segmental and somatic dysfunction of lumbar region: Secondary | ICD-10-CM | POA: Diagnosis not present

## 2020-06-18 DIAGNOSIS — M9903 Segmental and somatic dysfunction of lumbar region: Secondary | ICD-10-CM | POA: Diagnosis not present

## 2020-06-18 DIAGNOSIS — M47816 Spondylosis without myelopathy or radiculopathy, lumbar region: Secondary | ICD-10-CM | POA: Diagnosis not present

## 2020-06-22 DIAGNOSIS — M47816 Spondylosis without myelopathy or radiculopathy, lumbar region: Secondary | ICD-10-CM | POA: Diagnosis not present

## 2020-06-22 DIAGNOSIS — M9903 Segmental and somatic dysfunction of lumbar region: Secondary | ICD-10-CM | POA: Diagnosis not present

## 2020-06-29 DIAGNOSIS — M9903 Segmental and somatic dysfunction of lumbar region: Secondary | ICD-10-CM | POA: Diagnosis not present

## 2020-06-29 DIAGNOSIS — M47816 Spondylosis without myelopathy or radiculopathy, lumbar region: Secondary | ICD-10-CM | POA: Diagnosis not present

## 2020-07-08 DIAGNOSIS — M9903 Segmental and somatic dysfunction of lumbar region: Secondary | ICD-10-CM | POA: Diagnosis not present

## 2020-07-08 DIAGNOSIS — M47816 Spondylosis without myelopathy or radiculopathy, lumbar region: Secondary | ICD-10-CM | POA: Diagnosis not present

## 2020-07-16 DIAGNOSIS — M47816 Spondylosis without myelopathy or radiculopathy, lumbar region: Secondary | ICD-10-CM | POA: Diagnosis not present

## 2020-07-16 DIAGNOSIS — M9903 Segmental and somatic dysfunction of lumbar region: Secondary | ICD-10-CM | POA: Diagnosis not present

## 2020-07-22 DIAGNOSIS — M47816 Spondylosis without myelopathy or radiculopathy, lumbar region: Secondary | ICD-10-CM | POA: Diagnosis not present

## 2020-07-22 DIAGNOSIS — M9903 Segmental and somatic dysfunction of lumbar region: Secondary | ICD-10-CM | POA: Diagnosis not present

## 2020-07-29 DIAGNOSIS — M47816 Spondylosis without myelopathy or radiculopathy, lumbar region: Secondary | ICD-10-CM | POA: Diagnosis not present

## 2020-07-29 DIAGNOSIS — M9903 Segmental and somatic dysfunction of lumbar region: Secondary | ICD-10-CM | POA: Diagnosis not present

## 2020-08-04 DIAGNOSIS — M9903 Segmental and somatic dysfunction of lumbar region: Secondary | ICD-10-CM | POA: Diagnosis not present

## 2020-08-04 DIAGNOSIS — M47816 Spondylosis without myelopathy or radiculopathy, lumbar region: Secondary | ICD-10-CM | POA: Diagnosis not present

## 2020-08-11 DIAGNOSIS — J0191 Acute recurrent sinusitis, unspecified: Secondary | ICD-10-CM | POA: Diagnosis not present

## 2020-08-19 DIAGNOSIS — M47816 Spondylosis without myelopathy or radiculopathy, lumbar region: Secondary | ICD-10-CM | POA: Diagnosis not present

## 2020-08-19 DIAGNOSIS — M9903 Segmental and somatic dysfunction of lumbar region: Secondary | ICD-10-CM | POA: Diagnosis not present

## 2020-09-02 DIAGNOSIS — M9903 Segmental and somatic dysfunction of lumbar region: Secondary | ICD-10-CM | POA: Diagnosis not present

## 2020-09-02 DIAGNOSIS — M47816 Spondylosis without myelopathy or radiculopathy, lumbar region: Secondary | ICD-10-CM | POA: Diagnosis not present

## 2020-09-16 DIAGNOSIS — M9903 Segmental and somatic dysfunction of lumbar region: Secondary | ICD-10-CM | POA: Diagnosis not present

## 2020-09-16 DIAGNOSIS — M47816 Spondylosis without myelopathy or radiculopathy, lumbar region: Secondary | ICD-10-CM | POA: Diagnosis not present

## 2020-09-30 DIAGNOSIS — M47816 Spondylosis without myelopathy or radiculopathy, lumbar region: Secondary | ICD-10-CM | POA: Diagnosis not present

## 2020-09-30 DIAGNOSIS — M9903 Segmental and somatic dysfunction of lumbar region: Secondary | ICD-10-CM | POA: Diagnosis not present

## 2020-10-15 DIAGNOSIS — M47816 Spondylosis without myelopathy or radiculopathy, lumbar region: Secondary | ICD-10-CM | POA: Diagnosis not present

## 2020-10-15 DIAGNOSIS — M9903 Segmental and somatic dysfunction of lumbar region: Secondary | ICD-10-CM | POA: Diagnosis not present

## 2020-11-05 DIAGNOSIS — M9903 Segmental and somatic dysfunction of lumbar region: Secondary | ICD-10-CM | POA: Diagnosis not present

## 2020-11-05 DIAGNOSIS — M47816 Spondylosis without myelopathy or radiculopathy, lumbar region: Secondary | ICD-10-CM | POA: Diagnosis not present

## 2020-11-19 DIAGNOSIS — M9903 Segmental and somatic dysfunction of lumbar region: Secondary | ICD-10-CM | POA: Diagnosis not present

## 2020-11-19 DIAGNOSIS — M47816 Spondylosis without myelopathy or radiculopathy, lumbar region: Secondary | ICD-10-CM | POA: Diagnosis not present

## 2020-12-10 DIAGNOSIS — M47816 Spondylosis without myelopathy or radiculopathy, lumbar region: Secondary | ICD-10-CM | POA: Diagnosis not present

## 2020-12-10 DIAGNOSIS — M9903 Segmental and somatic dysfunction of lumbar region: Secondary | ICD-10-CM | POA: Diagnosis not present

## 2020-12-24 DIAGNOSIS — M9903 Segmental and somatic dysfunction of lumbar region: Secondary | ICD-10-CM | POA: Diagnosis not present

## 2020-12-24 DIAGNOSIS — M47816 Spondylosis without myelopathy or radiculopathy, lumbar region: Secondary | ICD-10-CM | POA: Diagnosis not present

## 2021-01-05 DIAGNOSIS — Z1231 Encounter for screening mammogram for malignant neoplasm of breast: Secondary | ICD-10-CM | POA: Diagnosis not present

## 2021-01-07 DIAGNOSIS — M9903 Segmental and somatic dysfunction of lumbar region: Secondary | ICD-10-CM | POA: Diagnosis not present

## 2021-01-07 DIAGNOSIS — M47816 Spondylosis without myelopathy or radiculopathy, lumbar region: Secondary | ICD-10-CM | POA: Diagnosis not present

## 2021-01-19 DIAGNOSIS — H60391 Other infective otitis externa, right ear: Secondary | ICD-10-CM | POA: Diagnosis not present

## 2021-01-28 DIAGNOSIS — C44712 Basal cell carcinoma of skin of right lower limb, including hip: Secondary | ICD-10-CM | POA: Diagnosis not present

## 2021-01-28 DIAGNOSIS — D225 Melanocytic nevi of trunk: Secondary | ICD-10-CM | POA: Diagnosis not present

## 2021-01-28 DIAGNOSIS — L821 Other seborrheic keratosis: Secondary | ICD-10-CM | POA: Diagnosis not present

## 2021-01-28 DIAGNOSIS — C44519 Basal cell carcinoma of skin of other part of trunk: Secondary | ICD-10-CM | POA: Diagnosis not present

## 2021-01-28 DIAGNOSIS — C44619 Basal cell carcinoma of skin of left upper limb, including shoulder: Secondary | ICD-10-CM | POA: Diagnosis not present

## 2021-01-29 DIAGNOSIS — M47816 Spondylosis without myelopathy or radiculopathy, lumbar region: Secondary | ICD-10-CM | POA: Diagnosis not present

## 2021-01-29 DIAGNOSIS — M9903 Segmental and somatic dysfunction of lumbar region: Secondary | ICD-10-CM | POA: Diagnosis not present

## 2021-02-26 DIAGNOSIS — M47816 Spondylosis without myelopathy or radiculopathy, lumbar region: Secondary | ICD-10-CM | POA: Diagnosis not present

## 2021-02-26 DIAGNOSIS — M9903 Segmental and somatic dysfunction of lumbar region: Secondary | ICD-10-CM | POA: Diagnosis not present

## 2021-03-01 DIAGNOSIS — M9903 Segmental and somatic dysfunction of lumbar region: Secondary | ICD-10-CM | POA: Diagnosis not present

## 2021-03-01 DIAGNOSIS — M47816 Spondylosis without myelopathy or radiculopathy, lumbar region: Secondary | ICD-10-CM | POA: Diagnosis not present

## 2021-03-02 DIAGNOSIS — M47816 Spondylosis without myelopathy or radiculopathy, lumbar region: Secondary | ICD-10-CM | POA: Diagnosis not present

## 2021-03-02 DIAGNOSIS — M9903 Segmental and somatic dysfunction of lumbar region: Secondary | ICD-10-CM | POA: Diagnosis not present

## 2021-03-03 DIAGNOSIS — M9903 Segmental and somatic dysfunction of lumbar region: Secondary | ICD-10-CM | POA: Diagnosis not present

## 2021-03-03 DIAGNOSIS — M47816 Spondylosis without myelopathy or radiculopathy, lumbar region: Secondary | ICD-10-CM | POA: Diagnosis not present

## 2021-03-04 DIAGNOSIS — Z85828 Personal history of other malignant neoplasm of skin: Secondary | ICD-10-CM | POA: Diagnosis not present

## 2021-03-04 DIAGNOSIS — Z08 Encounter for follow-up examination after completed treatment for malignant neoplasm: Secondary | ICD-10-CM | POA: Diagnosis not present

## 2021-03-05 DIAGNOSIS — E782 Mixed hyperlipidemia: Secondary | ICD-10-CM | POA: Diagnosis not present

## 2021-03-08 DIAGNOSIS — M47816 Spondylosis without myelopathy or radiculopathy, lumbar region: Secondary | ICD-10-CM | POA: Diagnosis not present

## 2021-03-08 DIAGNOSIS — M9903 Segmental and somatic dysfunction of lumbar region: Secondary | ICD-10-CM | POA: Diagnosis not present

## 2021-03-10 DIAGNOSIS — M47816 Spondylosis without myelopathy or radiculopathy, lumbar region: Secondary | ICD-10-CM | POA: Diagnosis not present

## 2021-03-10 DIAGNOSIS — M9903 Segmental and somatic dysfunction of lumbar region: Secondary | ICD-10-CM | POA: Diagnosis not present

## 2021-03-11 DIAGNOSIS — E782 Mixed hyperlipidemia: Secondary | ICD-10-CM | POA: Diagnosis not present

## 2021-03-11 DIAGNOSIS — M722 Plantar fascial fibromatosis: Secondary | ICD-10-CM | POA: Diagnosis not present

## 2021-03-11 DIAGNOSIS — M545 Low back pain, unspecified: Secondary | ICD-10-CM | POA: Diagnosis not present

## 2021-03-11 DIAGNOSIS — Z0001 Encounter for general adult medical examination with abnormal findings: Secondary | ICD-10-CM | POA: Diagnosis not present

## 2021-03-12 DIAGNOSIS — M47816 Spondylosis without myelopathy or radiculopathy, lumbar region: Secondary | ICD-10-CM | POA: Diagnosis not present

## 2021-03-12 DIAGNOSIS — M9903 Segmental and somatic dysfunction of lumbar region: Secondary | ICD-10-CM | POA: Diagnosis not present

## 2021-03-16 DIAGNOSIS — M47816 Spondylosis without myelopathy or radiculopathy, lumbar region: Secondary | ICD-10-CM | POA: Diagnosis not present

## 2021-03-16 DIAGNOSIS — M9903 Segmental and somatic dysfunction of lumbar region: Secondary | ICD-10-CM | POA: Diagnosis not present

## 2021-03-24 DIAGNOSIS — M47816 Spondylosis without myelopathy or radiculopathy, lumbar region: Secondary | ICD-10-CM | POA: Diagnosis not present

## 2021-03-24 DIAGNOSIS — M9903 Segmental and somatic dysfunction of lumbar region: Secondary | ICD-10-CM | POA: Diagnosis not present

## 2021-03-29 DIAGNOSIS — H43813 Vitreous degeneration, bilateral: Secondary | ICD-10-CM | POA: Diagnosis not present

## 2021-04-08 DIAGNOSIS — M47816 Spondylosis without myelopathy or radiculopathy, lumbar region: Secondary | ICD-10-CM | POA: Diagnosis not present

## 2021-04-08 DIAGNOSIS — M9903 Segmental and somatic dysfunction of lumbar region: Secondary | ICD-10-CM | POA: Diagnosis not present

## 2021-04-25 DIAGNOSIS — J019 Acute sinusitis, unspecified: Secondary | ICD-10-CM | POA: Diagnosis not present

## 2021-04-28 DIAGNOSIS — R059 Cough, unspecified: Secondary | ICD-10-CM | POA: Diagnosis not present

## 2021-05-03 DIAGNOSIS — M9903 Segmental and somatic dysfunction of lumbar region: Secondary | ICD-10-CM | POA: Diagnosis not present

## 2021-05-03 DIAGNOSIS — M47816 Spondylosis without myelopathy or radiculopathy, lumbar region: Secondary | ICD-10-CM | POA: Diagnosis not present

## 2021-07-16 DIAGNOSIS — H6121 Impacted cerumen, right ear: Secondary | ICD-10-CM | POA: Diagnosis not present

## 2021-09-29 ENCOUNTER — Encounter: Payer: Self-pay | Admitting: *Deleted

## 2021-09-29 ENCOUNTER — Telehealth: Payer: Self-pay | Admitting: *Deleted

## 2021-09-29 ENCOUNTER — Other Ambulatory Visit: Payer: Self-pay

## 2021-09-29 ENCOUNTER — Ambulatory Visit: Payer: Medicare Other | Admitting: Gastroenterology

## 2021-09-29 ENCOUNTER — Encounter: Payer: Self-pay | Admitting: Gastroenterology

## 2021-09-29 VITALS — BP 118/60 | HR 81 | Temp 97.1°F | Ht 63.0 in | Wt 184.4 lb

## 2021-09-29 DIAGNOSIS — R1013 Epigastric pain: Secondary | ICD-10-CM

## 2021-09-29 NOTE — Patient Instructions (Signed)
We are arranging an upper endoscopy with Dr. Gala Romney in the near future! ? ?You can check out [email or web address] and see what you think! ? ?We can make further recommendations after the endoscopy! ? ?It was a pleasure to see you today. I want to create trusting relationships with patients to provide genuine, compassionate, and quality care. I value your feedback. If you receive a survey regarding your visit,  I greatly appreciate you taking time to fill this out.  ? ?Annitta Needs, PhD, ANP-BC ?[facility]  ? ?

## 2021-09-29 NOTE — Progress Notes (Signed)
? ? ? ? ?Gastroenterology Office Note   ? ? ?Primary Care Physician:  Celene Squibb, MD  ?Primary Gastroenterologist: Dr. Gala Romney  ? ? ?Chief Complaint  ? ?Chief Complaint  ?Patient presents with  ? Gastroesophageal Reflux  ?  Burning, heartburn  ? Nausea  ? ? ? ?History of Present Illness  ? ?Heather Ali is a 66 y.o. female presenting today due to GERD and nausea. She was last seen in 2020.  ? ?She would rather avoid prescriptive therapy if possible. Currently taking OTC agents such as apple cider vinegar, digestive enzymes, [facility]. She attempts to stick with a healthy diet. Shake for breakfast, chicken/salad, protein/veggie for dinner.  ? ?No abdominal pain. She does have early satiety. She notes a vIrus first week in October: then started having "overboard" acid, some nausea. Has history of reflux in past but not severe. Would control with diet.  ? ?Lots of burping/passing gas. Eats a cake pop, took digestive enzyme that helped. Flet significant reflux and felt full before plate was empty. 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  ? ? ?No dysphagia. Very seldom NSAIDs.  ? ?Past Medical History:  ?Diagnosis Date  ? Arthritis   ? Cataract   ? ? ?Past Surgical History:  ?Procedure Laterality Date  ? COLONOSCOPY N/A 02/27/2019  ? two 3-4 mm polyps in ascending colon, s/p removal. No adenomas but pattern consistent with adenoma at time of colonoscopy but not appreciated on path. Surveillance due 2025.  ? CYSTECTOMY  1977  ? OVARY  ? POLYPECTOMY  02/27/2019  ? Procedure: POLYPECTOMY;  Surgeon: Daneil Dolin, MD;  Location: [facility] SUITE;  Service: Endoscopy;;  ascending colon  ? ? ?Current Outpatient Medications  ?Medication Sig Dispense Refill  ? fexofenadine (ALLEGRA) 60 MG tablet Take 60 mg by mouth as needed for allergies or rhinitis.    ? ?No current facility-administered medications for this visit.  ? ? ?Allergies as of 09/29/2021 - Review Complete  09/29/2021  ?Allergen Reaction Noted  ? Sulfa antibiotics  09/13/2016  ? ? ?Family History  ?Problem Relation Age of Onset  ? Hyperlipidemia Mother   ? Diabetes Father   ? Hypertension Father   ? Colon polyps Brother   ? Colon cancer Neg Hx   ? ? ?Social History  ? ?Socioeconomic History  ? Marital status: Widowed  ?  Spouse name: Not on file  ? Number of children: Not on file  ? Years of education: Not on file  ? Highest education level: Not on file  ?Occupational History  ? Occupation: Pharmacist, hospital   ?Tobacco Use  ? Smoking status: Never  ? Smokeless tobacco: Never  ?Vaping Use  ? Vaping Use: Never used  ?Substance and Sexual Activity  ? Alcohol use: Never  ? Drug use: Never  ? Sexual activity: Not Currently  ?Other Topics Concern  ? Not on file  ?Social History Narrative  ? Not on file  ? ?Social Determinants of Health  ? ?Financial Resource Strain: Not on file  ?Food Insecurity: Not on file  ?Transportation Needs: Not on file  ?Physical Activity: Not on file  ?Stress: Not on file  ?Social Connections: Not on file  ?Intimate Partner Violence: Not on file  ? ? ? ?Review of Systems  ? ?Gen: Denies any fever, chills, fatigue, weight loss, lack of appetite.  ?CV: Denies chest pain, heart palpitations, peripheral edema, syncope.  ?Resp: Denies shortness of breath  at rest or with exertion. Denies wheezing or cough.  ?GI:  see HPI ?GU : Denies urinary burning, urinary frequency, urinary hesitancy ?MS: Denies joint pain, muscle weakness, cramps, or limitation of movement.  ?Derm: Denies rash, itching, dry skin ?Psych: Denies depression, anxiety, memory loss, and confusion ?Heme: Denies bruising, bleeding, and enlarged lymph nodes. ? ? ?Physical Exam  ? ?BP 118/60   Pulse 81   Temp (!) 97.1 ?F (36.2 ?C)   Ht 5\' 3"  (1.6 m)   Wt 184 lb 6.4 oz (83.6 kg)   SpO2 97%   BMI 32.66 kg/m?  ?General:   Alert and oriented. Pleasant and cooperative. Well-nourished and well-developed.  ?Head:  Normocephalic and atraumatic. ?Eyes:   Without icterus ?Abdomen:  +BS, soft, non-tender and non-distended. No HSM noted. No guarding or rebound. No masses appreciated.  ?Rectal:  Deferred  ?Msk:  Symmetrical without gross deformities. Normal posture. ?Extremities:  Without edema. ?Neurologic:  Alert and  oriented x4;  grossly normal neurologically. ?Skin:  Intact without significant lesions or rashes. ?Psych:  Alert and cooperative. Normal mood and affect. ? ? ?Assessment  ? ?Heather Ali is a 66 y.o. female presenting today in follow-up with a history of GERD, now with worsening symptoms, early satiety, belching, and intermittent burping.  ? ?Symptom onset after a viral illness in October; she is currently not on a PPI and prefers to take OTC remedies at this point but willing to take prescriptive agents if necessary. 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  ? ?We discussed empiric PPI therapy vs EGD. She would like diagnostic EGD at this point. She is holding off on PPI therapy until following EGD. ? ? ? ?PLAN  ? ?Proceed with upper endoscopy by Dr. Gala Romney in near future: the risks, benefits, and alternatives have been discussed with the patient in detail. The patient states understanding and desires to proceed.  ? ?Decision regarding PPI therapy to follow EGD ? ?Colonoscopy surveillance 2025 ? ?Further recommendations to follow ? ? ?Annitta Needs, PhD, ANP-BC ?[facility]  ? ? ?

## 2021-09-29 NOTE — Telephone Encounter (Signed)
PA approved via [facility]. Auth# [id number], DOS: Oct 14, 2021 - Jan 12, 2022 ?

## 2021-10-11 ENCOUNTER — Telehealth: Payer: Self-pay | Admitting: *Deleted

## 2021-10-11 ENCOUNTER — Encounter: Payer: Self-pay | Admitting: Internal Medicine

## 2021-10-11 NOTE — Telephone Encounter (Signed)
Called pt, left detailed VM procedure cancelled for Thursday and to follow up in 3 months. Sending to front desk to schedule 3 month appt. Message sent to endo. ?

## 2021-10-11 NOTE — Telephone Encounter (Signed)
Patient left VM stating she is scheduled for procedure Thursday. She is feeling much better now and wants to know if she should just cancel her procedure for now? ?

## 2021-10-11 NOTE — Telephone Encounter (Signed)
Yes, that is fine. Let's make sure she has follow-up in about 3 months here.  ?

## 2021-10-14 ENCOUNTER — Encounter (HOSPITAL_COMMUNITY): Admission: RE | Payer: Self-pay | Source: Home / Self Care

## 2021-10-14 ENCOUNTER — Ambulatory Visit (HOSPITAL_COMMUNITY): Admission: RE | Admit: 2021-10-14 | Payer: Medicare Other | Source: Home / Self Care | Admitting: Internal Medicine

## 2021-10-14 SURGERY — ESOPHAGOGASTRODUODENOSCOPY (EGD) WITH PROPOFOL
Anesthesia: Monitor Anesthesia Care

## 2021-11-15 ENCOUNTER — Telehealth: Payer: Self-pay | Admitting: Internal Medicine

## 2021-11-15 NOTE — Telephone Encounter (Signed)
Tried to call pt, LMOVM for return call. 

## 2021-11-15 NOTE — Telephone Encounter (Signed)
It is fine to put her back on.  ?

## 2021-11-15 NOTE — Telephone Encounter (Signed)
Patient called and said that she is still having issues and now wants to schedule her egd  ?

## 2021-11-15 NOTE — Telephone Encounter (Signed)
Pt had cancelled procedure d/t feeling better and was advised to f/u in 3 months. Has f/u appt 01/25/22. Routing to Roseanne Kaufman NP for advice. ?

## 2021-11-16 NOTE — Telephone Encounter (Signed)
Spoke to pt, EGD ASA 2 w/Dr. Gala Romney scheduled for 12/01/21 at 8:15am. Orders entered. Instructions mailed.  ? ?PA previously obtained when scheduled 1st time. PA# [id number], valid 10/14/21-01/12/22. ?

## 2021-11-16 NOTE — Telephone Encounter (Signed)
Tried to call pt, LMOVM for return call. 

## 2021-12-01 ENCOUNTER — Other Ambulatory Visit: Payer: Self-pay

## 2021-12-01 ENCOUNTER — Telehealth: Payer: Self-pay

## 2021-12-01 ENCOUNTER — Ambulatory Visit (HOSPITAL_BASED_OUTPATIENT_CLINIC_OR_DEPARTMENT_OTHER): Payer: Medicare Other | Admitting: Certified Registered"

## 2021-12-01 ENCOUNTER — Ambulatory Visit (HOSPITAL_COMMUNITY): Payer: Medicare Other | Admitting: Certified Registered"

## 2021-12-01 ENCOUNTER — Encounter (HOSPITAL_COMMUNITY): Payer: Self-pay | Admitting: Internal Medicine

## 2021-12-01 ENCOUNTER — Encounter (HOSPITAL_COMMUNITY)
Admission: RE | Disposition: A | Discharge status: Home / Self Care | Payer: Self-pay | Source: Home / Self Care | Attending: Internal Medicine

## 2021-12-01 ENCOUNTER — Ambulatory Visit (HOSPITAL_COMMUNITY)
Admission: RE | Admit: 2021-12-01 | Discharge: 2021-12-01 | Disposition: A | Discharge status: Home / Self Care | Payer: Medicare Other | Attending: Internal Medicine | Admitting: Internal Medicine

## 2021-12-01 DIAGNOSIS — K2100 Gastro-esophageal reflux disease with esophagitis, without bleeding: Secondary | ICD-10-CM | POA: Insufficient documentation

## 2021-12-01 DIAGNOSIS — R12 Heartburn: Secondary | ICD-10-CM | POA: Diagnosis not present

## 2021-12-01 DIAGNOSIS — K449 Diaphragmatic hernia without obstruction or gangrene: Secondary | ICD-10-CM | POA: Diagnosis not present

## 2021-12-01 DIAGNOSIS — R1013 Epigastric pain: Secondary | ICD-10-CM | POA: Diagnosis not present

## 2021-12-01 HISTORY — PX: ESOPHAGOGASTRODUODENOSCOPY (EGD) WITH PROPOFOL: SHX5813

## 2021-12-01 SURGERY — ESOPHAGOGASTRODUODENOSCOPY (EGD) WITH PROPOFOL
Anesthesia: General

## 2021-12-01 MED ORDER — LIDOCAINE HCL (CARDIAC) PF 100 MG/5ML IV SOSY
PREFILLED_SYRINGE | INTRAVENOUS | Status: DC | PRN
  Administered 2021-12-01: 50 mg via INTRAVENOUS

## 2021-12-01 MED ORDER — PANTOPRAZOLE SODIUM 40 MG PO TBEC: 40.0000 mg | DELAYED_RELEASE_TABLET | Freq: Every day | ORAL | 3 refills | Status: DC

## 2021-12-01 MED ORDER — PROPOFOL 10 MG/ML IV BOLUS
INTRAVENOUS | Status: DC | PRN
  Administered 2021-12-01: 100 mg via INTRAVENOUS

## 2021-12-01 MED ORDER — LACTATED RINGERS IV SOLN: INTRAVENOUS | Status: DC | PRN

## 2021-12-01 MED ORDER — LACTATED RINGERS IV SOLN: INTRAVENOUS | Status: DC

## 2021-12-01 MED ORDER — PROPOFOL 500 MG/50ML IV EMUL
INTRAVENOUS | Status: DC | PRN
  Administered 2021-12-01: 150 ug/kg/min via INTRAVENOUS

## 2021-12-01 NOTE — Telephone Encounter (Signed)
Rx was sent in to pharmacy and pt was made aware.  ?

## 2021-12-01 NOTE — Telephone Encounter (Signed)
Omeprazole and lansoprazole are also a low tier option. ? ?

## 2021-12-01 NOTE — Transfer of Care (Signed)
Immediate Anesthesia Transfer of Care Note ? ?Patient: Heather Ali ? ?Procedure(s) Performed: ESOPHAGOGASTRODUODENOSCOPY (EGD) WITH PROPOFOL ? ?Patient Location: Endoscopy Unit ? ?Anesthesia Type:General ? ?Level of Consciousness: awake and oriented ? ?Airway & Oxygen Therapy: Patient Spontanous Breathing ? ?Post-op Assessment: Report given to RN and Post -op Vital signs reviewed and stable ? ?Post vital signs: Reviewed and stable ? ?Last Vitals:  ?Vitals Value Taken Time  ?BP    ?Temp    ?Pulse    ?Resp    ?SpO2    ? ? ?Last Pain:  ?Vitals:  ? 12/01/21 0704  ?TempSrc: Oral  ?PainSc: 0-No pain  ?   ? ?Patients Stated Pain Goal: 7 (12/01/21 0704) ? ?Complications: No notable events documented. ?

## 2021-12-01 NOTE — Anesthesia Preprocedure Evaluation (Signed)
Anesthesia Evaluation  ?Patient identified by MRN, date of birth, ID band ?Patient awake ? ? ? ?Reviewed: ?Allergy & Precautions, NPO status , Patient's Chart, lab work & pertinent test results ? ?Airway ?Mallampati: III ? ?TM Distance: <3 FB ?Neck ROM: Full ? ? ? Dental ? ?(+) Dental Advisory Given, Teeth Intact ?  ?Pulmonary ?neg pulmonary ROS,  ?  ?Pulmonary exam normal ?breath sounds clear to auscultation ? ? ? ? ? ? Cardiovascular ?negative cardio ROS ?Normal cardiovascular exam ?Rhythm:Regular Rate:Normal ? ? ?  ?Neuro/Psych ?negative neurological ROS ? negative psych ROS  ? GI/Hepatic ?Neg liver ROS, GERD  Medicated,  ?Endo/Other  ?negative endocrine ROS ? Renal/GU ?negative Renal ROS  ?negative genitourinary ?  ?Musculoskeletal ? ?(+) Arthritis ,  ? Abdominal ?  ?Peds ?negative pediatric ROS ?(+)  Hematology ?negative hematology ROS ?(+)   ?Anesthesia Other Findings ? ? Reproductive/Obstetrics ?negative OB ROS ? ?  ? ? ? ? ? ? ? ? ? ? ? ? ? ?  ?  ? ? ? ? ? ? ? ? ?Anesthesia Physical ?Anesthesia Plan ? ?ASA: 2 ? ?Anesthesia Plan: General  ? ?Post-op Pain Management: Minimal or no pain anticipated  ? ?Induction: Intravenous ? ?PONV Risk Score and Plan:  ? ?Airway Management Planned: Nasal Cannula and Natural Airway ? ?Additional Equipment:  ? ?Intra-op Plan:  ? ?Post-operative Plan:  ? ?Informed Consent: I have reviewed the patients History and Physical, chart, labs and discussed the procedure including the risks, benefits and alternatives for the proposed anesthesia with the patient or authorized representative who has indicated his/her understanding and acceptance.  ? ? ? ?Dental advisory given ? ?Plan Discussed with: CRNA and Surgeon ? ?Anesthesia Plan Comments:   ? ? ? ? ? ? ?Anesthesia Quick Evaluation ? ?

## 2021-12-01 NOTE — Anesthesia Postprocedure Evaluation (Signed)
Anesthesia Post Note ? ?Patient: Heather Ali ? ?Procedure(s) Performed: ESOPHAGOGASTRODUODENOSCOPY (EGD) WITH PROPOFOL ? ?Patient location during evaluation: Endoscopy ?Anesthesia Type: General ?Level of consciousness: awake and alert and oriented ?Pain management: pain level controlled ?Vital Signs Assessment: post-procedure vital signs reviewed and stable ?Respiratory status: spontaneous breathing, nonlabored ventilation and respiratory function stable ?Cardiovascular status: blood pressure returned to baseline and stable ?Postop Assessment: no apparent nausea or vomiting ?Anesthetic complications: no ? ? ?No notable events documented. ? ? ?Last Vitals:  ?Vitals:  ? 12/01/21 0704 12/01/21 0743  ?BP: (!) 125/53 97/63  ?Pulse: 85   ?Resp: 18 15  ?Temp: 36.8 ?C 36.8 ?C  ?SpO2: 97% 95%  ?  ?Last Pain:  ?Vitals:  ? 12/01/21 0743  ?TempSrc: Oral  ?PainSc: 0-No pain  ? ? ?  ?  ?  ?  ?  ?  ? ?Heather Ali C Lalonnie Shaffer ? ? ? ? ?

## 2021-12-01 NOTE — Op Note (Signed)
[facility] ?Patient Name: Heather Ali ?Procedure Date: 12/01/2021 7:07 AM ?MRN: [id number] ?Date of Birth: 1956-07-09 ?Attending MD: Norvel Richards , MD ?CSN: [id number] ?Age: 66 ?Admit Type: Outpatient ?Procedure:                Upper GI endoscopy ?Indications:              Dyspepsia, Heartburn ?Providers:                Norvel Richards, MD, Janeece Riggers, RN, Ulice Dash  ?                          Blima Singer, Technician ?Referring MD:              ?Medicines:                Propofol per Anesthesia ?Complications:            No immediate complications. ?Estimated Blood Loss:     Estimated blood loss: none. ?Procedure:                Pre-Anesthesia Assessment: ?                          - Prior to the procedure, a History and Physical  ?                          was performed, and patient medications and  ?                          allergies were reviewed. The patient's tolerance of  ?                          previous anesthesia was also reviewed. The risks  ?                          and benefits of the procedure and the sedation  ?                          options and risks were discussed with the patient.  ?                          All questions were answered, and informed consent  ?                          was obtained. Prior Anticoagulants: The patient has  ?                          taken no previous anticoagulant or antiplatelet  ?                          agents. ASA Grade Assessment: II - A patient with  ?                          mild systemic disease. After reviewing the risks  ?  and benefits, the patient was deemed in  ?                          satisfactory condition to undergo the procedure. ?                          After obtaining informed consent, the endoscope was  ?                          passed under direct vision. Throughout the  ?                          procedure, the patient's blood pressure, pulse, and  ?                          oxygen  saturations were monitored continuously. The  ?                          GIF-H190 ([id number]) scope was introduced through the  ?                          mouth, and advanced to the second part of duodenum.  ?                          The upper GI endoscopy was accomplished without  ?                          difficulty. The patient tolerated the procedure  ?                          well. ?Scope In: 7:35:08 AM ?Scope Out: 7:37:39 AM ?Total Procedure Duration: 0 hours 2 minutes 31 seconds  ?Findings: ?     multiple erosions and areas of ulceration within 2 cm of the GE  ?     junction. Patulous GE junction. No Barrett's epithelium seen.  ?     Medium-sized hiatal hernia. Gastric mucosa appeared normal. Patent  ?     pylorus. ?     The duodenal bulb and second portion of the duodenum were normal. ?Impression:               - Rather severe ulcerative/erosive reflux  ?                          esophagitis. Patulous EG junction. Medium size  ?                          hiatal hernia. ?                          - No specimens collected. ?Moderate Sedation: ?     Moderate (conscious) sedation was personally administered by an  ?     anesthesia professional. The following parameters were monitored: oxygen  ?     saturation, heart rate, blood pressure, respiratory rate, EKG, adequacy  ?     of pulmonary ventilation, and response to care. ?Recommendation:           -  Patient has a contact number available for  ?                          emergencies. The signs and symptoms of potential  ?                          delayed complications were discussed with the  ?                          patient. Return to normal activities tomorrow.  ?                          Written discharge instructions were provided to the  ?                          patient. ?                          - Advance diet as tolerated. ?                          - Continue present medications. Begin Dexilant 60  ?                          mg daily. Prescription  provided. ?                          - Return to my office in 3 months. ?Procedure Code(s):        --- Professional --- ?                          [phone], Esophagogastroduodenoscopy, flexible,  ?                          transoral; diagnostic, including collection of  ?                          specimen(s) by brushing or washing, when performed  ?                          (separate procedure) ?Diagnosis Code(s):        --- Professional --- ?                          K44.9, Diaphragmatic hernia without obstruction or  ?                          gangrene ?                          R10.13, Epigastric pain ?                          R12, Heartburn ?CPT copyright 2019 [employer]. All rights reserved. ?The codes documented in this report are preliminary and upon coder review may  ?be revised to meet current compliance requirements. ?Cristopher Estimable. Terita Hejl, MD ?Santo Held  Kasten Leveque, MD ?12/01/2021 7:51:37 AM ?This report has been signed electronically. ?Number of Addenda: 0 ?

## 2021-12-01 NOTE — Telephone Encounter (Signed)
Patient called back regarding medication. Looked online and according to her formulary pantoprazole would be the lowest tier. Do you want me to send that in for her? ?

## 2021-12-01 NOTE — Discharge Instructions (Addendum)
EGD ?Discharge instructions ?Please read the instructions outlined below and refer to this sheet in the next few weeks. These discharge instructions provide you with general information on caring for yourself after you leave the hospital. Your doctor may also give you specific instructions. While your treatment has been planned according to the most current medical practices available, unavoidable complications occasionally occur. If you have any problems or questions after discharge, please call your doctor. ?ACTIVITY ?You may resume your regular activity but move at a slower pace for the next 24 hours.  ?Take frequent rest periods for the next 24 hours.  ?Walking will help expel (get rid of) the air and reduce the bloated feeling in your abdomen.  ?No driving for 24 hours (because of the anesthesia (medicine) used during the test).  ?You may shower.  ?Do not sign any important legal documents or operate any machinery for 24 hours (because of the anesthesia used during the test).  ?NUTRITION ?Drink plenty of fluids.  ?You may resume your normal diet.  ?Begin with a light meal and progress to your normal diet.  ?Avoid alcoholic beverages for 24 hours or as instructed by your caregiver.  ?MEDICATIONS ?You may resume your normal medications unless your caregiver tells you otherwise.  ?WHAT YOU CAN EXPECT TODAY ?You may experience abdominal discomfort such as a feeling of fullness or ?gas? pains.  ?FOLLOW-UP ?Your doctor will discuss the results of your test with you.  ?SEEK IMMEDIATE MEDICAL ATTENTION IF ANY OF THE FOLLOWING OCCUR: ?Excessive nausea (feeling sick to your stomach) and/or vomiting.  ?Severe abdominal pain and distention (swelling).  ?Trouble swallowing.  ?Temperature over 101? F (37.8? C).  ?Rectal bleeding or vomiting of blood.   ? ? ? ?You have severe acid reflux injury in your esophagus.  You have a hiatal hernia. ? ?Begin Dexilant 60 mg daily.  1 capsule 30 minutes before breakfast ? ?GERD  information provided ? ?Office visit with me in 3 months ? ?At patient request, I called Corrinne Eagle at [phone] -reviewed findings and recommendations. ?

## 2021-12-01 NOTE — Anesthesia Procedure Notes (Signed)
Date/Time: 12/01/2021 7:34 AM ?Performed by: Orlie Dakin, CRNA ?Pre-anesthesia Checklist: Patient identified, Emergency Drugs available, Suction available and Patient being monitored ?Patient Re-evaluated:Patient Re-evaluated prior to induction ?Oxygen Delivery Method: Nasal cannula ?Induction Type: IV induction ?Placement Confirmation: positive ETCO2 ? ? ? ? ?

## 2021-12-01 NOTE — Telephone Encounter (Signed)
The pt is requesting a cheaper medication (other than Dexilant) that works the same to her pharmacy. She has a $100 co-pay even if generic. Pt states she is retiring in 3 weeks and can not afford to do this every month. Please advise ?

## 2021-12-01 NOTE — Telephone Encounter (Signed)
noted 

## 2021-12-01 NOTE — H&P (Signed)
?$'@LOGO'J$ @ ? ? ?Primary Care Physician:  Celene Squibb, MD ?Primary Gastroenterologist:  Dr. Gala Romney ? ?Pre-Procedure History & Physical: ?HPI:  Heather Ali is a 66 y.o. female here for further evaluation of poorly controlled GERD/dyspepsia.  No dysphagia.  No PPI (patient declined) ? ?Past Medical History:  ?Diagnosis Date  ? Arthritis   ? Cataract   ? ? ?Past Surgical History:  ?Procedure Laterality Date  ? COLONOSCOPY N/A 02/27/2019  ? two 3-4 mm polyps in ascending colon, s/p removal. No adenomas but pattern consistent with adenoma at time of colonoscopy but not appreciated on path. Surveillance due 2025.  ? CYSTECTOMY  1977  ? OVARY  ? POLYPECTOMY  02/27/2019  ? Procedure: POLYPECTOMY;  Surgeon: Daneil Dolin, MD;  Location: [facility];  Service: Endoscopy;;  ascending colon  ? ? ?Prior to Admission medications   ?Medication Sig Start Date End Date Taking? Authorizing Provider  ?CALCIUM-VITAMIN D-VITAMIN K PO Take 1 tablet by mouth daily.   Yes [provider]  ?fexofenadine (ALLEGRA) 180 MG tablet Take 180 mg by mouth daily as needed for allergies or rhinitis.   Yes [provider]  ?Multiple Vitamins-Minerals (PRESERVISION AREDS 2+MULTI VIT) CAPS Take 1 capsule by mouth daily.   Yes [provider]  ?Probiotic Product (PROBIOTIC DAILY PO) Take 1 capsule by mouth daily.   Yes [provider]  ? ? ?Allergies as of 11/16/2021 - Review Complete 09/29/2021  ?Allergen Reaction Noted  ? Sulfa antibiotics  09/13/2016  ? ? ?Family History  ?Problem Relation Age of Onset  ? Hyperlipidemia Mother   ? Diabetes Father   ? Hypertension Father   ? Colon polyps Brother   ? Colon cancer Neg Hx   ? ? ?Social History  ? ?Socioeconomic History  ? Marital status: Widowed  ?  Spouse name: Not on file  ? Number of children: Not on file  ? Years of education: Not on file  ? Highest education level: Not on file  ?Occupational History  ? Occupation: Pharmacist, hospital   ?Tobacco Use  ? Smoking status: Never   ? Smokeless tobacco: Never  ?Vaping Use  ? Vaping Use: Never used  ?Substance and Sexual Activity  ? Alcohol use: Never  ? Drug use: Never  ? Sexual activity: Not Currently  ?Other Topics Concern  ? Not on file  ?Social History Narrative  ? Not on file  ? ?Social Determinants of Health  ? ?Financial Resource Strain: Not on file  ?Food Insecurity: Not on file  ?Transportation Needs: Not on file  ?Physical Activity: Not on file  ?Stress: Not on file  ?Social Connections: Not on file  ?Intimate Partner Violence: Not on file  ? ? ?Review of Systems: ?See HPI, otherwise negative ROS ? ?Physical Exam: ?BP (!) 125/53   Pulse 85   Temp 98.2 ?F (36.8 ?C) (Oral)   Resp 18   Ht '5\' 3"'$  (1.6 m)   Wt 83 kg   SpO2 97%   BMI 32.42 kg/m?  ?General:   Alert,  Well-developed, well-nourished, pleasant and cooperative in NAD ?Neck:  Supple; no masses or thyromegaly. No significant cervical adenopathy. ?Lungs:  Clear throughout to auscultation.   No wheezes, crackles, or rhonchi. No acute distress. ?Heart:  Regular rate and rhythm; no murmurs, clicks, rubs,  or gallops. ?Abdomen: Non-distended, normal bowel sounds.  Soft and nontender without appreciable mass or hepatosplenomegaly.  ?Pulses:  Normal pulses noted. ?Extremities:  Without clubbing or edema. ? ?Impression/Plan: 66 year old lady  here for further evaluation of refractory GERD/dyspepsia symptoms.  I have offered the patient a diagnostic EGD. ?The risks, benefits, limitations, alternatives and imponderables have been reviewed with the patient. Potential for esophageal dilation, biopsy, etc. have also been reviewed.  Questions have been answered. All parties agreeable.  ? ? ? ? ?Notice: This dictation was prepared with Dragon dictation along with smaller phrase technology. Any transcriptional errors that result from this process are unintentional and may not be corrected upon review.  ?

## 2021-12-07 DIAGNOSIS — N390 Urinary tract infection, site not specified: Secondary | ICD-10-CM | POA: Diagnosis not present

## 2021-12-09 ENCOUNTER — Encounter (HOSPITAL_COMMUNITY): Payer: Self-pay | Admitting: Internal Medicine

## 2022-01-25 ENCOUNTER — Ambulatory Visit: Payer: Medicare Other | Admitting: Gastroenterology

## 2022-01-28 DIAGNOSIS — Z1231 Encounter for screening mammogram for malignant neoplasm of breast: Secondary | ICD-10-CM | POA: Diagnosis not present

## 2022-03-01 ENCOUNTER — Encounter: Payer: Self-pay | Admitting: Internal Medicine

## 2022-03-01 ENCOUNTER — Ambulatory Visit (INDEPENDENT_AMBULATORY_CARE_PROVIDER_SITE_OTHER): Payer: Medicare Other | Admitting: Internal Medicine

## 2022-03-01 VITALS — BP 102/66 | HR 95 | Temp 97.3°F | Ht 63.0 in | Wt 187.0 lb

## 2022-03-01 DIAGNOSIS — K219 Gastro-esophageal reflux disease without esophagitis: Secondary | ICD-10-CM | POA: Diagnosis not present

## 2022-03-01 MED ORDER — PANTOPRAZOLE SODIUM 40 MG PO TBEC: 40.0000 mg | DELAYED_RELEASE_TABLET | Freq: Every day | ORAL | 3 refills | Status: DC

## 2022-03-01 NOTE — Patient Instructions (Signed)
It was good to see you again today!  GERD information provided  Continue pantoprazole or Protonix 40 mg each day 30 minutes before breakfast  If you could lose 10 to 15 pounds in the next 12 months that would be of great benefit to your health overall.  Office visit here in 1 year  Follow-up colonoscopy 2025.

## 2022-03-01 NOTE — Progress Notes (Signed)
Primary Care Physician:  Celene Squibb, MD Primary Gastroenterologist:  Dr. Gala Romney  Pre-Procedure History & Physical: HPI:  Heather Ali is a 66 y.o. female here for GERD/erosive reflux esophagitis.  No Barrett's epithelium seen at EGD.  Patient was PPI nave.  Started her on Protonix 40 mg daily.  She states this has been a great medication.  Reflux symptoms essentially abolished on this regimen.  She is trying to lose weight.  She is swimming at the [facility] every day.  Recently retired as a Pharmacist, hospital. History of colonic polyps; due for surveillance 2025.  Past Medical History:  Diagnosis Date   Arthritis    Cataract     Past Surgical History:  Procedure Laterality Date   COLONOSCOPY N/A 02/27/2019   two 3-4 mm polyps in ascending colon, s/p removal. No adenomas but pattern consistent with adenoma at time of colonoscopy but not appreciated on path. Surveillance due 2025.   CYSTECTOMY  1977   OVARY   ESOPHAGOGASTRODUODENOSCOPY (EGD) WITH PROPOFOL N/A 12/01/2021   Procedure: ESOPHAGOGASTRODUODENOSCOPY (EGD) WITH PROPOFOL;  Surgeon: Daneil Dolin, MD;  Location: [facility];  Service: Endoscopy;  Laterality: N/A;  8:15am   POLYPECTOMY  02/27/2019   Procedure: POLYPECTOMY;  Surgeon: Daneil Dolin, MD;  Location: [facility];  Service: Endoscopy;;  ascending colon    Prior to Admission medications   Medication Sig Start Date End Date Taking? Authorizing Provider  CALCIUM-VITAMIN D-VITAMIN K PO Take 1 tablet by mouth daily.   Yes [provider]  fexofenadine (ALLEGRA) 180 MG tablet Take 180 mg by mouth daily as needed for allergies or rhinitis.   Yes [provider]  Multiple Vitamins-Minerals (PRESERVISION AREDS 2+MULTI VIT) CAPS Take 1 capsule by mouth daily.   Yes [provider]  pantoprazole (PROTONIX) 40 MG tablet Take 1 tablet (40 mg total) by mouth daily. 12/01/21  Yes Finneus Kaneshiro, Cristopher Estimable, MD  Probiotic Product (PROBIOTIC DAILY PO) Take 1 capsule by mouth  daily.   Yes [provider]    Allergies as of 03/01/2022 - Review Complete 03/01/2022  Allergen Reaction Noted   Sulfa antibiotics Rash 09/13/2016    Family History  Problem Relation Age of Onset   Hyperlipidemia Mother    Diabetes Father    Hypertension Father    Colon polyps Brother    Colon cancer Neg Hx     Social History   Socioeconomic History   Marital status: Widowed    Spouse name: Not on file   Number of children: Not on file   Years of education: Not on file   Highest education level: Not on file  Occupational History   Occupation: Teacher   Tobacco Use   Smoking status: Never   Smokeless tobacco: Never  Vaping Use   Vaping Use: Never used  Substance and Sexual Activity   Alcohol use: Never   Drug use: Never   Sexual activity: Not Currently  Other Topics Concern   Not on file  Social History Narrative   Not on file   Social Determinants of Health   Financial Resource Strain: Not on file  Food Insecurity: Not on file  Transportation Needs: Not on file  Physical Activity: Not on file  Stress: Not on file  Social Connections: Not on file  Intimate Partner Violence: Not on file    Review of Systems: See HPI, otherwise negative ROS  Physical Exam: BP 102/66 (BP Location: Right Arm, Patient Position: Sitting, Cuff Size: Normal)  Pulse 95   Temp (!) 97.3 F (36.3 C) (Temporal)   Ht '5\' 3"'$  (1.6 m)   Wt 187 lb (84.8 kg)   SpO2 96%   BMI 33.13 kg/m  General:   Alert,  Well-developed, well-nourished, pleasant and cooperative in NAD  Impression/Plan: 66 year old lady with erosive reflux esophagitis now on a PPI doing extremely well.  We talked about the multipronged approach to reflux. Specifically, we discussed the risks and benefits of long-term acid suppression therapy.  History of colonic polyps-due for surveillance 2025.  Recommendations:  GERD information provided  Continue pantoprazole or Protonix 40 mg each day 30 minutes  before breakfast  If you could lose 10 to 15 pounds in the next 12 months that would be of great benefit to your health overall.  Office visit here in 1 year  Follow-up colonoscopy 2025.      Notice: This dictation was prepared with Dragon dictation along with smaller phrase technology. Any transcriptional errors that result from this process are unintentional and may not be corrected upon review.

## 2022-03-10 DIAGNOSIS — R7301 Impaired fasting glucose: Secondary | ICD-10-CM | POA: Diagnosis not present

## 2022-03-10 DIAGNOSIS — E782 Mixed hyperlipidemia: Secondary | ICD-10-CM | POA: Diagnosis not present

## 2022-03-14 DIAGNOSIS — H3589 Other specified retinal disorders: Secondary | ICD-10-CM | POA: Diagnosis not present

## 2022-03-14 DIAGNOSIS — H43813 Vitreous degeneration, bilateral: Secondary | ICD-10-CM | POA: Diagnosis not present

## 2022-03-14 DIAGNOSIS — H2513 Age-related nuclear cataract, bilateral: Secondary | ICD-10-CM | POA: Diagnosis not present

## 2022-03-14 DIAGNOSIS — H524 Presbyopia: Secondary | ICD-10-CM | POA: Diagnosis not present

## 2022-03-17 DIAGNOSIS — M722 Plantar fascial fibromatosis: Secondary | ICD-10-CM | POA: Diagnosis not present

## 2022-03-17 DIAGNOSIS — R809 Proteinuria, unspecified: Secondary | ICD-10-CM | POA: Diagnosis not present

## 2022-03-17 DIAGNOSIS — Z0001 Encounter for general adult medical examination with abnormal findings: Secondary | ICD-10-CM | POA: Diagnosis not present

## 2022-03-17 DIAGNOSIS — K219 Gastro-esophageal reflux disease without esophagitis: Secondary | ICD-10-CM | POA: Diagnosis not present

## 2022-03-17 DIAGNOSIS — R7301 Impaired fasting glucose: Secondary | ICD-10-CM | POA: Diagnosis not present

## 2022-03-17 DIAGNOSIS — E782 Mixed hyperlipidemia: Secondary | ICD-10-CM | POA: Diagnosis not present

## 2022-03-18 ENCOUNTER — Other Ambulatory Visit (HOSPITAL_COMMUNITY): Payer: Self-pay | Admitting: Internal Medicine

## 2022-03-18 DIAGNOSIS — E782 Mixed hyperlipidemia: Secondary | ICD-10-CM

## 2022-03-18 DIAGNOSIS — M8580 Other specified disorders of bone density and structure, unspecified site: Secondary | ICD-10-CM

## 2022-03-21 ENCOUNTER — Ambulatory Visit (HOSPITAL_COMMUNITY)
Admission: RE | Admit: 2022-03-21 | Discharge: 2022-03-21 | Disposition: A | Discharge status: Home / Self Care | Payer: Medicare Other | Source: Ambulatory Visit | Attending: Internal Medicine | Admitting: Internal Medicine

## 2022-03-21 DIAGNOSIS — M85852 Other specified disorders of bone density and structure, left thigh: Secondary | ICD-10-CM | POA: Diagnosis not present

## 2022-03-21 DIAGNOSIS — Z13820 Encounter for screening for osteoporosis: Secondary | ICD-10-CM | POA: Diagnosis not present

## 2022-03-21 DIAGNOSIS — M810 Age-related osteoporosis without current pathological fracture: Secondary | ICD-10-CM | POA: Diagnosis not present

## 2022-03-21 DIAGNOSIS — Z780 Asymptomatic menopausal state: Secondary | ICD-10-CM | POA: Diagnosis not present

## 2022-03-21 DIAGNOSIS — M8580 Other specified disorders of bone density and structure, unspecified site: Secondary | ICD-10-CM | POA: Insufficient documentation

## 2022-04-27 ENCOUNTER — Ambulatory Visit (HOSPITAL_COMMUNITY)
Admission: RE | Admit: 2022-04-27 | Discharge: 2022-04-27 | Disposition: A | Discharge status: Home / Self Care | Payer: Medicare Other | Source: Ambulatory Visit | Attending: Internal Medicine | Admitting: Internal Medicine

## 2022-04-27 DIAGNOSIS — E782 Mixed hyperlipidemia: Secondary | ICD-10-CM | POA: Insufficient documentation

## 2022-06-15 DIAGNOSIS — H9202 Otalgia, left ear: Secondary | ICD-10-CM | POA: Diagnosis not present

## 2022-06-15 DIAGNOSIS — H8112 Benign paroxysmal vertigo, left ear: Secondary | ICD-10-CM | POA: Diagnosis not present

## 2022-08-08 DIAGNOSIS — H25811 Combined forms of age-related cataract, right eye: Secondary | ICD-10-CM | POA: Diagnosis not present

## 2022-08-08 DIAGNOSIS — H25812 Combined forms of age-related cataract, left eye: Secondary | ICD-10-CM | POA: Diagnosis not present

## 2022-08-24 DIAGNOSIS — H269 Unspecified cataract: Secondary | ICD-10-CM | POA: Diagnosis not present

## 2022-08-24 DIAGNOSIS — H25811 Combined forms of age-related cataract, right eye: Secondary | ICD-10-CM | POA: Diagnosis not present

## 2022-08-31 DIAGNOSIS — H2512 Age-related nuclear cataract, left eye: Secondary | ICD-10-CM | POA: Diagnosis not present

## 2022-08-31 DIAGNOSIS — H269 Unspecified cataract: Secondary | ICD-10-CM | POA: Diagnosis not present

## 2022-08-31 DIAGNOSIS — H25812 Combined forms of age-related cataract, left eye: Secondary | ICD-10-CM | POA: Diagnosis not present

## 2022-08-31 DIAGNOSIS — H25811 Combined forms of age-related cataract, right eye: Secondary | ICD-10-CM | POA: Diagnosis not present

## 2022-09-29 DIAGNOSIS — Z85828 Personal history of other malignant neoplasm of skin: Secondary | ICD-10-CM | POA: Diagnosis not present

## 2022-09-29 DIAGNOSIS — D2271 Melanocytic nevi of right lower limb, including hip: Secondary | ICD-10-CM | POA: Diagnosis not present

## 2022-09-29 DIAGNOSIS — Z08 Encounter for follow-up examination after completed treatment for malignant neoplasm: Secondary | ICD-10-CM | POA: Diagnosis not present

## 2022-09-29 DIAGNOSIS — X32XXXD Exposure to sunlight, subsequent encounter: Secondary | ICD-10-CM | POA: Diagnosis not present

## 2022-09-29 DIAGNOSIS — Z1283 Encounter for screening for malignant neoplasm of skin: Secondary | ICD-10-CM | POA: Diagnosis not present

## 2022-09-29 DIAGNOSIS — D485 Neoplasm of uncertain behavior of skin: Secondary | ICD-10-CM | POA: Diagnosis not present

## 2022-09-29 DIAGNOSIS — L570 Actinic keratosis: Secondary | ICD-10-CM | POA: Diagnosis not present

## 2022-09-29 DIAGNOSIS — D225 Melanocytic nevi of trunk: Secondary | ICD-10-CM | POA: Diagnosis not present

## 2022-10-17 DIAGNOSIS — R059 Cough, unspecified: Secondary | ICD-10-CM | POA: Diagnosis not present

## 2022-10-17 DIAGNOSIS — E782 Mixed hyperlipidemia: Secondary | ICD-10-CM | POA: Diagnosis not present

## 2022-10-17 DIAGNOSIS — R053 Chronic cough: Secondary | ICD-10-CM | POA: Diagnosis not present

## 2022-10-17 DIAGNOSIS — K219 Gastro-esophageal reflux disease without esophagitis: Secondary | ICD-10-CM | POA: Diagnosis not present

## 2022-10-17 DIAGNOSIS — M810 Age-related osteoporosis without current pathological fracture: Secondary | ICD-10-CM | POA: Diagnosis not present

## 2022-10-17 DIAGNOSIS — R911 Solitary pulmonary nodule: Secondary | ICD-10-CM | POA: Diagnosis not present

## 2022-10-17 DIAGNOSIS — Z7182 Exercise counseling: Secondary | ICD-10-CM | POA: Diagnosis not present

## 2022-10-17 DIAGNOSIS — Z713 Dietary counseling and surveillance: Secondary | ICD-10-CM | POA: Diagnosis not present

## 2022-10-17 DIAGNOSIS — I700 Atherosclerosis of aorta: Secondary | ICD-10-CM | POA: Diagnosis not present

## 2022-10-24 DIAGNOSIS — R509 Fever, unspecified: Secondary | ICD-10-CM | POA: Diagnosis not present

## 2022-10-24 DIAGNOSIS — J019 Acute sinusitis, unspecified: Secondary | ICD-10-CM | POA: Diagnosis not present

## 2022-10-24 DIAGNOSIS — R059 Cough, unspecified: Secondary | ICD-10-CM | POA: Diagnosis not present

## 2022-12-01 DIAGNOSIS — D485 Neoplasm of uncertain behavior of skin: Secondary | ICD-10-CM | POA: Diagnosis not present

## 2022-12-01 DIAGNOSIS — L988 Other specified disorders of the skin and subcutaneous tissue: Secondary | ICD-10-CM | POA: Diagnosis not present

## 2022-12-09 DIAGNOSIS — J019 Acute sinusitis, unspecified: Secondary | ICD-10-CM | POA: Diagnosis not present

## 2022-12-09 DIAGNOSIS — J069 Acute upper respiratory infection, unspecified: Secondary | ICD-10-CM | POA: Diagnosis not present

## 2023-01-06 ENCOUNTER — Encounter: Payer: Self-pay | Admitting: Internal Medicine

## 2023-02-06 DIAGNOSIS — R350 Frequency of micturition: Secondary | ICD-10-CM | POA: Diagnosis not present

## 2023-02-06 DIAGNOSIS — R300 Dysuria: Secondary | ICD-10-CM | POA: Diagnosis not present

## 2023-02-20 DIAGNOSIS — Z1231 Encounter for screening mammogram for malignant neoplasm of breast: Secondary | ICD-10-CM | POA: Diagnosis not present

## 2023-03-07 ENCOUNTER — Encounter: Payer: Self-pay | Admitting: Orthopaedic Surgery

## 2023-03-07 ENCOUNTER — Encounter: Payer: Self-pay | Admitting: Internal Medicine

## 2023-03-07 ENCOUNTER — Ambulatory Visit (INDEPENDENT_AMBULATORY_CARE_PROVIDER_SITE_OTHER): Payer: Medicare Other

## 2023-03-07 ENCOUNTER — Ambulatory Visit: Payer: Medicare Other | Admitting: Orthopaedic Surgery

## 2023-03-07 ENCOUNTER — Ambulatory Visit: Payer: Medicare Other | Admitting: Internal Medicine

## 2023-03-07 VITALS — BP 122/72 | HR 84 | Temp 97.5°F | Ht 63.0 in | Wt 194.2 lb

## 2023-03-07 VITALS — BP 110/72 | HR 86 | Ht 63.0 in | Wt 192.8 lb

## 2023-03-07 DIAGNOSIS — K219 Gastro-esophageal reflux disease without esophagitis: Secondary | ICD-10-CM

## 2023-03-07 DIAGNOSIS — Z86010 Personal history of colonic polyps: Secondary | ICD-10-CM

## 2023-03-07 DIAGNOSIS — E782 Mixed hyperlipidemia: Secondary | ICD-10-CM | POA: Diagnosis not present

## 2023-03-07 DIAGNOSIS — M25572 Pain in left ankle and joints of left foot: Secondary | ICD-10-CM | POA: Diagnosis not present

## 2023-03-07 MED ORDER — PANTOPRAZOLE SODIUM 40 MG PO TBEC: 40.0000 mg | DELAYED_RELEASE_TABLET | Freq: Every day | ORAL | 3 refills | Status: DC

## 2023-03-07 NOTE — Patient Instructions (Signed)
It was good to see you again today!  Continue Protonix 40 mg daily-best taken 30 minutes before breakfast.  (Refill dispense 90 with 3 additional refills)  GERD information provided  Consider seeing a nutritionist regarding weight management  Continue going to the [employer]-that is great!  Office visit her in 1 year.  Will discuss performing 1 more colonoscopy at that time.

## 2023-03-07 NOTE — Patient Instructions (Signed)
Use Aspercreme, Biofreeze or Voltaren Gel to Achilles of left foot. Rub in after putting it on. Do three times a day.  Use ice to the area.  Use beach shoes or good sandal at beach.

## 2023-03-07 NOTE — Progress Notes (Signed)
Subjective:    Patient ID: Heather Ali, female    DOB: [birth year], 67 y.o.   MRN: [id number]  HPI She has pain at the insertion of the left Achilles tendon. It occurred walking over loose deep sand the first of June. She has had pain since then. She has no redness, no swelling, no paresthesias.  She has no change in shoes.  It is not getting any better. She has used ice and Tylenol with no help.  She has no trauma.   Review of Systems  Constitutional:  Positive for activity change.  Musculoskeletal:  Positive for arthralgias, gait problem and myalgias.  All other systems reviewed and are negative. For Review of Systems, all other systems reviewed and are negative.  The following is a summary of the past history medically, past history surgically, known current medicines, social history and family history.  This information is gathered electronically by the computer from prior information and documentation.  I review this each visit and have found including this information at this point in the chart is beneficial and informative.   Past Medical History:  Diagnosis Date   Arthritis    Cataract     Past Surgical History:  Procedure Laterality Date   COLONOSCOPY N/A 02/27/2019   two 3-4 mm polyps in ascending colon, s/p removal. No adenomas but pattern consistent with adenoma at time of colonoscopy but not appreciated on path. Surveillance due 2025.   CYSTECTOMY  1977   OVARY   ESOPHAGOGASTRODUODENOSCOPY (EGD) WITH PROPOFOL N/A 12/01/2021   Procedure: ESOPHAGOGASTRODUODENOSCOPY (EGD) WITH PROPOFOL;  Surgeon: Corbin Ade, MD;  [facility];  Service: Endoscopy;  Laterality: N/A;  8:15am   POLYPECTOMY  02/27/2019   Procedure: POLYPECTOMY;  Surgeon: Corbin Ade, MD;  [facility]: [facility];  Service: Endoscopy;;  ascending colon    Current Outpatient Medications on File Prior to Visit  Medication Sig Dispense Refill   cyanocobalamin (VITAMIN B12) 1000 MCG tablet  Take 1,000 mcg by mouth daily.     magnesium gluconate (MAGONATE) 500 MG tablet Take 500 mg by mouth daily at 2 PM.     CALCIUM-VITAMIN D-VITAMIN K PO Take 1 tablet by mouth daily.     Multiple Vitamins-Minerals (PRESERVISION AREDS 2+MULTI VIT) CAPS Take 1 capsule by mouth daily.     pantoprazole (PROTONIX) 40 MG tablet Take 1 tablet (40 mg total) by mouth daily. 90 tablet 3   Probiotic Product (PROBIOTIC DAILY PO) Take 1 capsule by mouth daily.     No current facility-administered medications on file prior to visit.    Social History   Socioeconomic History   Marital status: Widowed    Spouse name: Not on file   Number of children: Not on file   Years of education: Not on file   Highest education level: Not on file  Occupational History   Occupation: Teacher   Tobacco Use   Smoking status: Never   Smokeless tobacco: Never  Vaping Use   Vaping status: Never Used  Substance and Sexual Activity   Alcohol use: Never   Drug use: Never   Sexual activity: Not Currently  Other Topics Concern   Not on file  Social History Narrative   Not on file   Social Determinants of Health   Financial Resource Strain: Not on file  Food Insecurity: Not on file  Transportation Needs: Not on file  Physical Activity: Not on file  Stress: Not on file  Social Connections: Not on  file  Intimate Partner Violence: Not on file    Family History  Problem Relation Age of Onset   Hyperlipidemia Mother    Diabetes Father    Hypertension Father    Colon polyps Brother    Colon cancer Neg Hx     BP 110/72   Pulse 86   Ht 5\' 3"  (1.6 m)   Wt 192 lb 12.8 oz (87.5 kg)   BMI 34.15 kg/m   Body mass index is 34.15 kg/m.      Objective:   Physical Exam Vitals and nursing note reviewed. Exam conducted with a chaperone present.  Constitutional:      Appearance: She is well-developed.  HENT:     Head: Normocephalic and atraumatic.  Eyes:     Conjunctiva/sclera: Conjunctivae normal.      Pupils: Pupils are equal, round, and reactive to light.  Cardiovascular:     Rate and Rhythm: Normal rate and regular rhythm.  Pulmonary:     Effort: Pulmonary effort is normal.  Abdominal:     Palpations: Abdomen is soft.  Musculoskeletal:     Cervical back: Normal range of motion and neck supple.       Legs:  Skin:    General: Skin is warm and dry.  Neurological:     Mental Status: She is alert and oriented to person, place, and time.     Cranial Nerves: No cranial nerve deficit.     Motor: No abnormal muscle tone.     Coordination: Coordination normal.     Deep Tendon Reflexes: Reflexes are normal and symmetric. Reflexes normal.  Psychiatric:        Behavior: Behavior normal.        Thought Content: Thought content normal.        Judgment: Judgment normal.   X-rays were done of the left foot, reported separately.        Assessment & Plan:   Encounter Diagnosis  Name Primary?   Pain in left ankle and joints of left foot Yes   I have recommended she use Biofreeze, Aspercreme or Voltaren Gel to the area three times a day.  Use ice.  I mentioned a medrol dose pack but she declined as she had a reaction before.    I told her to try Aleve one twice a day after eating.  She prefers to avoid oral medicines but will try.  I will see her back in three weeks.  She may need MRI.  Call if any problem.  Precautions discussed.  Electronically Signed Darreld Mclean, MD 8/6/202411:18 AM

## 2023-03-07 NOTE — Progress Notes (Unsigned)
Primary Care Physician:  Benita Stabile, MD Primary Gastroenterologist:  Dr. Jena Gauss  Pre-Procedure History & Physical: HPI:  Heather Ali is a 67 y.o. female here for follow-up of GERD.  Doing very well on Protonix 40 mg daily.  She is taking it before breakfast.  No dysphagia or other symptoms.  No bowel symptoms.  She is very happy.  She has had trouble losing weight she has gained 7 pounds since her last visit here.  Wants weight management advice. History of colonic polyps; plan for 1 more colonoscopy next year.   Past Medical History:  Diagnosis Date   Arthritis    Cataract     Past Surgical History:  Procedure Laterality Date   COLONOSCOPY N/A 02/27/2019   two 3-4 mm polyps in ascending colon, s/p removal. No adenomas but pattern consistent with adenoma at time of colonoscopy but not appreciated on path. Surveillance due 2025.   CYSTECTOMY  1977   OVARY   ESOPHAGOGASTRODUODENOSCOPY (EGD) WITH PROPOFOL N/A 12/01/2021   Procedure: ESOPHAGOGASTRODUODENOSCOPY (EGD) WITH PROPOFOL;  Surgeon: Corbin Ade, MD;  [facility];  Service: Endoscopy;  Laterality: N/A;  8:15am   POLYPECTOMY  02/27/2019   Procedure: POLYPECTOMY;  Surgeon: Corbin Ade, MD;  [facility];  Service: Endoscopy;;  ascending colon    Prior to Admission medications   Medication Sig Start Date End Date Taking? Authorizing Provider  CALCIUM-VITAMIN D-VITAMIN K PO Take 1 tablet by mouth daily.   Yes [provider]  cyanocobalamin (VITAMIN B12) 1000 MCG tablet Take 1,000 mcg by mouth daily.   Yes [provider]  magnesium gluconate (MAGONATE) 500 MG tablet Take 500 mg by mouth daily at 2 PM.   Yes [provider]  Multiple Vitamins-Minerals (PRESERVISION AREDS 2+MULTI VIT) CAPS Take 1 capsule by mouth daily.   Yes [provider]  pantoprazole (PROTONIX) 40 MG tablet Take 1 tablet (40 mg total) by mouth daily. 03/01/22  Yes Coby Shrewsberry, Gerrit Friends, MD  Probiotic  Product (PROBIOTIC DAILY PO) Take 1 capsule by mouth daily.   Yes [provider]    Allergies as of 03/07/2023 - Review Complete 03/07/2023  Allergen Reaction Noted   Sulfa antibiotics Rash 09/13/2016    Family History  Problem Relation Age of Onset   Hyperlipidemia Mother    Diabetes Father    Hypertension Father    Colon polyps Brother    Colon cancer Neg Hx     Social History   Socioeconomic History   Marital status: Widowed    Spouse name: Not on file   Number of children: Not on file   Years of education: Not on file   Highest education level: Not on file  Occupational History   Occupation: Teacher   Tobacco Use   Smoking status: Never   Smokeless tobacco: Never  Vaping Use   Vaping status: Never Used  Substance and Sexual Activity   Alcohol use: Never   Drug use: Never   Sexual activity: Not Currently  Other Topics Concern   Not on file  Social History Narrative   Not on file   Social Determinants of Health   Financial Resource Strain: Not on file  Food Insecurity: Not on file  Transportation Needs: Not on file  Physical Activity: Not on file  Stress: Not on file  Social Connections: Not on file  Intimate Partner Violence: Not on file    Review of Systems: See HPI, otherwise negative ROS  Physical Exam: BP 122/72 (BP Location: Right Arm, Patient Position: Sitting, Cuff Size: Large)   Pulse 84   Temp (!) 97.5 F (36.4 C) (Oral)   Ht 5\' 3"  (1.6 m)   Wt 194 lb 3.2 oz (88.1 kg)   SpO2 98%   BMI 34.40 kg/m  General:   Alert,  Well-developed, well-nourished, pleasant and cooperative in NAD  Impression/Plan: 67 year old lady with GERD well-controlled on Protonix daily.  We talked about the risk and benefits.  The benefits here far outweigh the risk.  Multipronged approach to weight loss recommended Recommendations:  Continue Protonix 40 mg daily-best taken 30 minutes before breakfast.  (Refill dispense 90 with 3 additional  refills)  GERD information provided  Consider seeing a nutritionist regarding weight management  Continue going to the [employer]-that is great!  Office visit her in 1 year.  Will discuss performing 1 more colonoscopy at that time.  Notice: This dictation was prepared with Dragon dictation along with smaller phrase technology. Any transcriptional errors that result from this process are unintentional and may not be corrected upon review.

## 2023-03-21 DIAGNOSIS — M810 Age-related osteoporosis without current pathological fracture: Secondary | ICD-10-CM | POA: Diagnosis not present

## 2023-03-21 DIAGNOSIS — Z0001 Encounter for general adult medical examination with abnormal findings: Secondary | ICD-10-CM | POA: Diagnosis not present

## 2023-03-21 DIAGNOSIS — Z85828 Personal history of other malignant neoplasm of skin: Secondary | ICD-10-CM | POA: Diagnosis not present

## 2023-03-21 DIAGNOSIS — K219 Gastro-esophageal reflux disease without esophagitis: Secondary | ICD-10-CM | POA: Diagnosis not present

## 2023-03-21 DIAGNOSIS — Z87442 Personal history of urinary calculi: Secondary | ICD-10-CM | POA: Diagnosis not present

## 2023-03-21 DIAGNOSIS — R809 Proteinuria, unspecified: Secondary | ICD-10-CM | POA: Diagnosis not present

## 2023-03-21 DIAGNOSIS — E782 Mixed hyperlipidemia: Secondary | ICD-10-CM | POA: Diagnosis not present

## 2023-03-21 DIAGNOSIS — R911 Solitary pulmonary nodule: Secondary | ICD-10-CM | POA: Diagnosis not present

## 2023-03-21 DIAGNOSIS — R7301 Impaired fasting glucose: Secondary | ICD-10-CM | POA: Diagnosis not present

## 2023-03-21 DIAGNOSIS — I700 Atherosclerosis of aorta: Secondary | ICD-10-CM | POA: Diagnosis not present

## 2023-03-21 DIAGNOSIS — Z86010 Personal history of colonic polyps: Secondary | ICD-10-CM | POA: Diagnosis not present

## 2023-04-11 ENCOUNTER — Ambulatory Visit: Payer: Medicare Other | Admitting: Orthopaedic Surgery

## 2023-04-11 ENCOUNTER — Encounter: Payer: Self-pay | Admitting: Orthopaedic Surgery

## 2023-04-11 VITALS — BP 108/73 | HR 94 | Ht 64.0 in | Wt 193.0 lb

## 2023-04-11 DIAGNOSIS — M25572 Pain in left ankle and joints of left foot: Secondary | ICD-10-CM

## 2023-04-11 NOTE — Patient Instructions (Signed)
-  Central scheduling [phone] -[facility] -Schedule appointment for mri then call office to give [place] the date of mri so we can schedule you back in 10-12 days after mri when we have results

## 2023-04-11 NOTE — Progress Notes (Signed)
My heel is still hurting.  She has pain still in the heel insertion on the left.  She went to the beach and it got worse. She has been taking the Aleve bid.  She has no new trauma.  She is tender at the insertion of the Achilles on the left with no defect. There is no swelling or redness.  Encounter Diagnosis  Name Primary?   Pain in left ankle and joints of left foot Yes   I will get MRI of the left heel.  Return in three weeks.  Continue the Aleve and Aspercreme.  Call if any problem.  Precautions discussed.  Electronically Signed Darreld Mclean, MD 9/10/20249:46 AM

## 2023-05-03 ENCOUNTER — Ambulatory Visit
Admission: RE | Admit: 2023-05-03 | Discharge: 2023-05-03 | Disposition: A | Discharge status: Home / Self Care | Payer: Medicare Other | Source: Ambulatory Visit | Attending: Orthopaedic Surgery | Admitting: Orthopaedic Surgery

## 2023-05-03 DIAGNOSIS — M25572 Pain in left ankle and joints of left foot: Secondary | ICD-10-CM

## 2023-05-16 ENCOUNTER — Encounter: Payer: Self-pay | Admitting: Orthopaedic Surgery

## 2023-05-16 ENCOUNTER — Ambulatory Visit: Payer: Medicare Other | Admitting: Orthopaedic Surgery

## 2023-05-16 VITALS — BP 108/73 | Wt 193.0 lb

## 2023-05-16 DIAGNOSIS — M7661 Achilles tendinitis, right leg: Secondary | ICD-10-CM

## 2023-05-16 DIAGNOSIS — M25572 Pain in left ankle and joints of left foot: Secondary | ICD-10-CM

## 2023-05-16 NOTE — Progress Notes (Signed)
My Achilles still hurts.  She had the MRI of the right ankle and it showed: IMPRESSION: 1. Mild tendinosis of the distal Achilles tendon without tear. 2. Otherwise unremarkable MRI of the left ankle.  I have explained the findings to her.  I have independently reviewed the MRI.    She has changed shoes so they do not rub her heel area and Achilles.  She still hurts.  She has used ice, heat, rubs, rest, elevation and she still has the pain.  Right Achilles is not that tender early this morning.  She says it gets worse as the day goes on.  NV intact.  Gait is good.  Encounter Diagnoses  Name Primary?   Pain in left ankle and joints of left foot Yes   Achilles tendinitis, right leg    I will see her as needed.  She may need to consider the "shock wave therapy".  Call if any problem.  Precautions discussed.  Electronically Signed Darreld Mclean, MD 10/15/20248:28 AM

## 2023-11-20 DIAGNOSIS — H26493 Other secondary cataract, bilateral: Secondary | ICD-10-CM | POA: Diagnosis not present

## 2024-02-07 DIAGNOSIS — M7661 Achilles tendinitis, right leg: Secondary | ICD-10-CM | POA: Diagnosis not present

## 2024-02-07 DIAGNOSIS — M7662 Achilles tendinitis, left leg: Secondary | ICD-10-CM | POA: Diagnosis not present

## 2024-02-08 ENCOUNTER — Encounter (INDEPENDENT_AMBULATORY_CARE_PROVIDER_SITE_OTHER): Payer: Self-pay | Admitting: *Deleted

## 2024-02-21 ENCOUNTER — Telehealth: Payer: Self-pay

## 2024-02-21 DIAGNOSIS — Z1231 Encounter for screening mammogram for malignant neoplasm of breast: Secondary | ICD-10-CM | POA: Diagnosis not present

## 2024-02-21 NOTE — Telephone Encounter (Signed)
 Who is your primary care physician: Dr.Zach Shona  Reasons for the colonoscopy: Hx polyps  Have you had a colonoscopy before?  Yes 02/27/2019 Dr.Rourk  Do you have family history of colon cancer? no  Previous colonoscopy with polyps removed? yes  Do you have a history colorectal cancer?   no  Are you diabetic? If yes, Type 1 or Type 2?    no  Do you have a prosthetic or mechanical heart valve? no  Do you have a pacemaker/defibrillator?   no  Have you had endocarditis/atrial fibrillation? no  Have you had joint replacement within the last 12 months?  no  Do you tend to be constipated or have to use laxatives? no  Do you have any history of drugs or alchohol?  no  Do you use supplemental oxygen?  no  Have you had a stroke or heart attack within the last 6 months? no  Do you take weight loss medication?  no  For female patients: have you had a hysterectomy?  no                                     are you post menopausal?       yes                                            do you still have your menstrual cycle? No 2007      Do you take any blood-thinning medications such as: (aspirin, warfarin, Plavix, Aggrenox)  no  If yes we need the name, milligram, dosage and who is prescribing doctor  Current Outpatient Medications on File Prior to Visit  Medication Sig Dispense Refill   pantoprazole  (PROTONIX ) 40 MG tablet Take 1 tablet (40 mg total) by mouth daily. 90 tablet 3   No current facility-administered medications on file prior to visit.    Allergies  Allergen Reactions   Sulfa Antibiotics Rash    Severe GI Upset      Pharmacy: [facility]  Primary Insurance Name: [facility] [id number]  Best number where you can be reached: [phone]

## 2024-02-22 ENCOUNTER — Other Ambulatory Visit: Payer: Self-pay | Admitting: Internal Medicine

## 2024-02-26 DIAGNOSIS — M7661 Achilles tendinitis, right leg: Secondary | ICD-10-CM | POA: Diagnosis not present

## 2024-02-26 DIAGNOSIS — M6289 Other specified disorders of muscle: Secondary | ICD-10-CM | POA: Diagnosis not present

## 2024-02-29 DIAGNOSIS — L821 Other seborrheic keratosis: Secondary | ICD-10-CM | POA: Diagnosis not present

## 2024-02-29 DIAGNOSIS — X32XXXD Exposure to sunlight, subsequent encounter: Secondary | ICD-10-CM | POA: Diagnosis not present

## 2024-02-29 DIAGNOSIS — L570 Actinic keratosis: Secondary | ICD-10-CM | POA: Diagnosis not present

## 2024-02-29 DIAGNOSIS — Z1283 Encounter for screening for malignant neoplasm of skin: Secondary | ICD-10-CM | POA: Diagnosis not present

## 2024-02-29 DIAGNOSIS — C44519 Basal cell carcinoma of skin of other part of trunk: Secondary | ICD-10-CM | POA: Diagnosis not present

## 2024-02-29 DIAGNOSIS — D225 Melanocytic nevi of trunk: Secondary | ICD-10-CM | POA: Diagnosis not present

## 2024-03-12 ENCOUNTER — Ambulatory Visit: Admitting: Internal Medicine

## 2024-03-12 ENCOUNTER — Encounter: Payer: Self-pay | Admitting: Internal Medicine

## 2024-03-12 VITALS — BP 122/70 | HR 90 | Temp 97.8°F | Ht 63.0 in | Wt 199.8 lb

## 2024-03-12 DIAGNOSIS — K219 Gastro-esophageal reflux disease without esophagitis: Secondary | ICD-10-CM

## 2024-03-12 DIAGNOSIS — K2100 Gastro-esophageal reflux disease with esophagitis, without bleeding: Secondary | ICD-10-CM | POA: Diagnosis not present

## 2024-03-12 DIAGNOSIS — Z860100 Personal history of colon polyps, unspecified: Secondary | ICD-10-CM | POA: Diagnosis not present

## 2024-03-12 NOTE — Progress Notes (Signed)
 Primary Care Physician:  Shona Norleen PEDLAR, MD Primary Gastroenterologist:  Dr. Shaaron  Pre-Procedure History & Physical: HPI:  Heather Ali is a 68 y.o. female here for follow-up of GERD.  History of at least grade B esophagitis on prior EGD no Barrett's.  Doing very well now on pantoprazole .  Rare breakthrough symptoms no dysphagia.  Had a couple of subcentimeter polyps removed from her colon 5 years ago.  Endoscopic appearance consistent with that of an adenoma but not borne out on pathology.  They were benign.  At this point, would recommend screening colonoscopy in 2030.  Past Medical History:  Diagnosis Date   Arthritis    Cataract     Past Surgical History:  Procedure Laterality Date   COLONOSCOPY N/A 02/27/2019   two 3-4 mm polyps in ascending colon, s/p removal. No adenomas but pattern consistent with adenoma at time of colonoscopy but not appreciated on path. Surveillance due 2025.   CYSTECTOMY  1977   OVARY   ESOPHAGOGASTRODUODENOSCOPY (EGD) WITH PROPOFOL  N/A 12/01/2021   Procedure: ESOPHAGOGASTRODUODENOSCOPY (EGD) WITH PROPOFOL ;  Surgeon: Shaaron Lamar HERO, MD;  [facility];  Service: Endoscopy;  Laterality: N/A;  8:15am   POLYPECTOMY  02/27/2019   Procedure: POLYPECTOMY;  Surgeon: Shaaron Lamar HERO, MD;  [facility];  Service: Endoscopy;;  ascending colon    Prior to Admission medications   Medication Sig Start Date End Date Taking? Authorizing Provider  CALCIUM-VITAMIN D-VITAMIN K PO Take by mouth.   Yes [provider]  cyanocobalamin (VITAMIN B12) 1000 MCG tablet Take 1,000 mcg by mouth daily.   Yes [provider]  diclofenac Sodium (VOLTAREN) 1 % GEL Apply 2 g topically 2 (two) times daily.   Yes [provider]  nitroGLYCERIN (NITRODUR - DOSED IN MG/24 HR) 0.1 mg/hr patch Place 0.1 mg onto the skin daily. 02/26/24  Yes [provider]  pantoprazole  (PROTONIX ) 40 MG tablet TAKE ONE TABLET BY MOUTH EVERY DAY. 02/22/24   Yes Teva Bronkema, Lamar HERO, MD  Probiotic Product (PROBIOTIC PO) Take by mouth.   Yes [provider]    Allergies as of 03/12/2024 - Review Complete 03/12/2024  Allergen Reaction Noted   Sulfa antibiotics Rash 09/13/2016    Family History  Problem Relation Age of Onset   Hyperlipidemia Mother    Diabetes Father    Hypertension Father    Colon polyps Brother    Colon cancer Neg Hx     Social History   Socioeconomic History   Marital status: Widowed    Spouse name: Not on file   Number of children: Not on file   Years of education: Not on file   Highest education level: Not on file  Occupational History   Occupation: Teacher   Tobacco Use   Smoking status: Never   Smokeless tobacco: Never  Vaping Use   Vaping status: Never Used  Substance and Sexual Activity   Alcohol use: Never   Drug use: Never   Sexual activity: Not Currently  Other Topics Concern   Not on file  Social History Narrative   Not on file   Social Drivers of Health   Financial Resource Strain: Not on file  Food Insecurity: Not on file  Transportation Needs: Not on file  Physical Activity: Not on file  Stress: Not on file  Social Connections: Not on file  Intimate Partner Violence: Not on file    Review of Systems: See HPI, otherwise negative ROS  Physical  Exam: BP 122/70 (BP Location: Right Arm, Patient Position: Sitting, Cuff Size: Normal)   Pulse 90   Temp 97.8 F (36.6 C) (Oral)   Ht 5' 3 (1.6 m)   Wt 199 lb 12.8 oz (90.6 kg)   SpO2 95%   BMI 35.39 kg/m  General:   Alert,  Well-developed, well-nourished, pleasant and cooperative in NAD Neck:  Supple; no masses or thyromegaly. No significant cervical adenopathy. Lungs:  Clear throughout to auscultation.   No wheezes, crackles, or rhonchi. No acute distress. Heart:  Regular rate and rhythm; no murmurs, clicks, rubs,  or gallops. Abdomen: Non-distended, normal bowel sounds.  Soft and nontender without appreciable mass or  hepatosplenomegaly.   Impression/Plan: Pleasant 68 year old lady with at least grade B Lux esophagitis seen on prior EGD no Barrett's.  Doing very well with pantoprazole  40 mg daily.  History of benign polyps removed 2020; due for screening exam 2030.  Recommendations:  Continue pantoprazole  Protonix  40 mg every day.  Best taken 30 minutes before breakfast  As discussed, we will plan for 1 more colonoscopy in 2030.  Unless something comes up, we will plan to see her back in 1 year.           Notice: This dictation was prepared with Dragon dictation along with smaller phrase technology. Any transcriptional errors that result from this process are unintentional and may not be corrected upon review.

## 2024-03-12 NOTE — Patient Instructions (Signed)
 It was good to see you again today!  Continue pantoprazole  Protonix  40 mg every day.  Best taken 30 minutes before breakfast  As discussed, we will plan for 1 more colonoscopy in 2030.  Unless something comes up, we will plan to see her back in 1 year.

## 2024-03-13 DIAGNOSIS — M7661 Achilles tendinitis, right leg: Secondary | ICD-10-CM | POA: Diagnosis not present

## 2024-03-22 ENCOUNTER — Encounter: Payer: Self-pay | Admitting: Radiology

## 2024-03-22 DIAGNOSIS — R300 Dysuria: Secondary | ICD-10-CM | POA: Diagnosis not present

## 2024-03-27 NOTE — Telephone Encounter (Signed)
 Dr. Shaaron, can you take a look at this? Looks like at the time of patient's colonoscopy in 2020, you said repeat exam in 5 years, but at most recent OV in August 2025, you said next colonoscopy in 2030.

## 2024-03-28 NOTE — Telephone Encounter (Signed)
 Thank you, Dr. Shaaron.   Mindy/Tammy: Can you let patient know Dr. Ivonne recommendations?  [name]: Please place patient on recall for colonoscopy in July 2027.

## 2024-03-28 NOTE — Telephone Encounter (Signed)
LMOVM to call back to make aware.

## 2024-04-02 NOTE — Telephone Encounter (Signed)
 Pt aware. Recall in chart incorrect. Made [name] aware

## 2024-04-02 NOTE — Telephone Encounter (Signed)
LMOVM to make aware

## 2024-04-16 DIAGNOSIS — E782 Mixed hyperlipidemia: Secondary | ICD-10-CM | POA: Diagnosis not present

## 2024-04-16 DIAGNOSIS — M810 Age-related osteoporosis without current pathological fracture: Secondary | ICD-10-CM | POA: Diagnosis not present

## 2024-04-16 DIAGNOSIS — R7301 Impaired fasting glucose: Secondary | ICD-10-CM | POA: Diagnosis not present

## 2024-04-16 LAB — COMPREHENSIVE METABOLIC PANEL WITH GFR
Albumin: 4.1 (ref 3.5–5.0)
Calcium: 9.6 (ref 8.7–10.7)
Globulin: 2.3
eGFR: 78

## 2024-04-16 LAB — CBC AND DIFFERENTIAL
HCT: 49 — AB (ref 36–46)
Hemoglobin: 14.9 (ref 12.0–16.0)
WBC: 5.9

## 2024-04-16 LAB — VITAMIN D 25 HYDROXY (VIT D DEFICIENCY, FRACTURES): Vit D, 25-Hydroxy: 45.7

## 2024-04-16 LAB — LIPID PANEL
Cholesterol: 270 — AB (ref 0–200)
HDL: 62 (ref 35–70)
LDL Cholesterol: 188
LDl/HDL Ratio: 4.4
Triglycerides: 112 (ref 40–160)

## 2024-04-16 LAB — BASIC METABOLIC PANEL WITH GFR
BUN: 20 (ref 4–21)
CO2: 25 — AB (ref 13–22)
Chloride: 103 (ref 99–108)
Creatinine: 0.8 (ref 0.5–1.1)
Glucose: 90
Potassium: 4.7 meq/L (ref 3.5–5.1)
Sodium: 141 (ref 137–147)

## 2024-04-16 LAB — TSH: TSH: 1.89 (ref 0.41–5.90)

## 2024-04-16 LAB — HEPATIC FUNCTION PANEL
ALT: 22 U/L (ref 7–35)
AST: 18 (ref 13–35)
Alkaline Phosphatase: 100 (ref 25–125)

## 2024-04-16 LAB — CBC: RBC: 5.14 — AB (ref 3.87–5.11)

## 2024-04-16 LAB — MICROALBUMIN / CREATININE URINE RATIO: Microalb Creat Ratio: 7

## 2024-04-16 LAB — PROTEIN / CREATININE RATIO, URINE
Albumin, U: 3
Creatinine, Urine: 41.9

## 2024-04-16 LAB — HEMOGLOBIN A1C: Hemoglobin A1C: 5.8

## 2024-04-23 ENCOUNTER — Other Ambulatory Visit (HOSPITAL_COMMUNITY): Payer: Self-pay | Admitting: Internal Medicine

## 2024-04-23 DIAGNOSIS — E782 Mixed hyperlipidemia: Secondary | ICD-10-CM | POA: Diagnosis not present

## 2024-04-23 DIAGNOSIS — I700 Atherosclerosis of aorta: Secondary | ICD-10-CM | POA: Diagnosis not present

## 2024-04-23 DIAGNOSIS — R7301 Impaired fasting glucose: Secondary | ICD-10-CM | POA: Diagnosis not present

## 2024-04-23 DIAGNOSIS — Z860100 Personal history of colon polyps, unspecified: Secondary | ICD-10-CM | POA: Diagnosis not present

## 2024-04-23 DIAGNOSIS — R809 Proteinuria, unspecified: Secondary | ICD-10-CM | POA: Diagnosis not present

## 2024-04-23 DIAGNOSIS — Z0000 Encounter for general adult medical examination without abnormal findings: Secondary | ICD-10-CM | POA: Diagnosis not present

## 2024-04-23 DIAGNOSIS — K219 Gastro-esophageal reflux disease without esophagitis: Secondary | ICD-10-CM | POA: Diagnosis not present

## 2024-04-23 DIAGNOSIS — M810 Age-related osteoporosis without current pathological fracture: Secondary | ICD-10-CM | POA: Diagnosis not present

## 2024-04-23 DIAGNOSIS — Z87442 Personal history of urinary calculi: Secondary | ICD-10-CM | POA: Diagnosis not present

## 2024-04-23 DIAGNOSIS — R911 Solitary pulmonary nodule: Secondary | ICD-10-CM | POA: Diagnosis not present

## 2024-04-23 DIAGNOSIS — Z85828 Personal history of other malignant neoplasm of skin: Secondary | ICD-10-CM | POA: Diagnosis not present

## 2024-04-25 ENCOUNTER — Encounter (INDEPENDENT_AMBULATORY_CARE_PROVIDER_SITE_OTHER): Payer: Self-pay

## 2024-04-26 ENCOUNTER — Ambulatory Visit (HOSPITAL_COMMUNITY)
Admission: RE | Admit: 2024-04-26 | Discharge: 2024-04-26 | Disposition: A | Discharge status: Home / Self Care | Source: Ambulatory Visit | Attending: Internal Medicine | Admitting: Internal Medicine

## 2024-04-26 DIAGNOSIS — M810 Age-related osteoporosis without current pathological fracture: Secondary | ICD-10-CM | POA: Insufficient documentation

## 2024-04-26 DIAGNOSIS — Z780 Asymptomatic menopausal state: Secondary | ICD-10-CM | POA: Diagnosis not present

## 2024-06-03 ENCOUNTER — Encounter: Payer: Self-pay | Admitting: Radiology

## 2024-08-14 DIAGNOSIS — Z0289 Encounter for other administrative examinations: Secondary | ICD-10-CM

## 2024-08-15 ENCOUNTER — Telehealth (INDEPENDENT_AMBULATORY_CARE_PROVIDER_SITE_OTHER): Payer: Self-pay | Admitting: Nurse Practitioner

## 2024-08-15 ENCOUNTER — Encounter (INDEPENDENT_AMBULATORY_CARE_PROVIDER_SITE_OTHER): Payer: Self-pay | Admitting: Nurse Practitioner

## 2024-08-15 ENCOUNTER — Ambulatory Visit (INDEPENDENT_AMBULATORY_CARE_PROVIDER_SITE_OTHER): Admitting: Nurse Practitioner

## 2024-08-15 VITALS — BP 114/78 | HR 86 | Temp 98.0°F | Ht 62.0 in | Wt 192.0 lb

## 2024-08-15 DIAGNOSIS — E559 Vitamin D deficiency, unspecified: Secondary | ICD-10-CM | POA: Diagnosis not present

## 2024-08-15 DIAGNOSIS — K219 Gastro-esophageal reflux disease without esophagitis: Secondary | ICD-10-CM

## 2024-08-15 DIAGNOSIS — Z6835 Body mass index (BMI) 35.0-35.9, adult: Secondary | ICD-10-CM

## 2024-08-15 DIAGNOSIS — E66812 Obesity, class 2: Secondary | ICD-10-CM | POA: Diagnosis not present

## 2024-08-15 NOTE — Telephone Encounter (Signed)
 New Patient Script Check List Patient Acknowledges Each Item Below:  []   There is a one-time $99.00 Program Fee.  []   You must have a Body Mass Index (BMI) of 30 or higher to qualify for our program.  []   Once you become a New Patient and schedule your first appointment, you are responsible for completing our New Patient Packet.  []   Prior to the first appointment, you will be required to fast for eight hours  []   You must also drink plenty of water that day before and morning of your new patient appointment.  []   You will also have an electrocardiogram (EKG) during your New Patient visit  do not apply any lotions or creams prior to the appointment.   []   You will also have a metabolic breathing test known as the Indirect Calorimetry (IC) Test  []   We are a Specialty office, but we bill as a Primary Care. You will have the same co-payments and co-insurances as you would at your primary care provider's office.  []   We have a 5-minute grace period. If you arrive later than this time, we will need to reschedule your appointment to a later time or date.  []   There is a $50 fee if the initial consultation visit, New Patient Appointment, or First Follow-up Appointment is a no-show, so please make sure to pick a time that works with your schedule.   []   We recommend that all our patients sign up for and utilize [employer].   Went over information with pt when packet given.

## 2024-08-15 NOTE — Progress Notes (Signed)
 " [address] [place], [place], [place] [address] Office: [phone]  /  Fax: [phone]   Initial Consultation    Amrit Erck was seen in clinic today to evaluate for obesity. She is interested in losing weight to improve overall health and reduce the risk of weight related complications. She presents today to review program treatment options, initial physical assessment, and evaluation.    Yelitza does have GERD and takes Protonix  40 mg every day, symptoms are currently well controlled. She retired 2 years ago.  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   Anthropometrics and Bioimpedance Analysis   Body mass index is 35.12 kg/m. Body Fat Mass : 44.8 % Visceral Fat Mass Rating : 14   Obesity Related Diseases and Complications  Obesity Quality of Life and Psychosocial Complications: Body image dissatisfaction and Reduced health-related quality of life  Cardiometabolic: Dyslipidemia or hypercholesterolemia, DOE, and Fatigue  Biomechanical: Osteoarthritis of the knee or hip and GERD   Weight Related History  She was referred by: PCP  When asked what they would like to accomplish? She states: Adopt a healthier eating pattern and lifestyle, Improve energy levels and physical activity, Improve existing medical conditions, Improve quality of life, Improve appearance, Improve self-confidence, and Lose 40 lbs  Weight history: Was in 180's in her 50's. She lost a lot of weight with death of husband in 09/11/2010- weighed 157. Gradual increase since that time.  Highest weight: 192  Contributing factors: family history of obesity, reduced physical activity, chronic skipping of meals, menopause, strong orexigenic signaling and/or inadequate inhibitory control , multiple weight loss attempts in the past, and self - critic or all-or-none mindset  Prior weight loss attempts: Intermittent fasting and High Protein  Current or previous pharmacotherapy:  None  Response to medication: Never tried medications  Current nutrition plan: High-protein  Greatest challenge with dieting: dieting fatigue.  Current level of physical activity: NEAT  Barriers to Exercise: orthopedic problems- plantar fasciitis previously, now achilles tendonitis.  Readiness and Motivation  On a scale from 0 to 10 How ready are you to make changes to your eating and physical activity to lose weight? 10 How important is it for you to lose weight right now ? 10 How confident are you that you can lose weight if you try? 9  Past Medical History   Past Medical History:  Diagnosis Date   Arthritis    Cataract      Objective    BP 114/78   Pulse 86   Temp 98 F (36.7 C)   Ht 5' 2 (1.575 m)   Wt 192 lb (87.1 kg)   SpO2 95%   BMI 35.12 kg/m  She was weighed on the bioimpedance scale: Body mass index is 35.12 kg/m.    General:  Alert, oriented and cooperative. Patient is in no acute distress.  Respiratory: Normal respiratory effort, no problems with respiration noted   Gait: able to ambulate independently  Mental Status: Normal mood and affect. Normal behavior. Normal judgment and thought content.   Diagnostic Data Reviewed  BMET No results found for: NA, K, CL, CO2, GLUCOSE, BUN, CREATININE, CALCIUM, GFRNONAA, GFRAA No results found for: HGBA1C No results found for: INSULIN CBC No results found for: WBC, RBC, HGB, HCT, PLT, MCV, MCH, MCHC, RDW Iron/TIBC/Ferritin/ %Sat No results found for: IRON, TIBC, FERRITIN, IRONPCTSAT Lipid Panel  No results found for: CHOL, TRIG, HDL, CHOLHDL, VLDL, LDLCALC, LDLDIRECT Hepatic Function Panel  No results  found for: PROT, ALBUMIN, AST, ALT, ALKPHOS, BILITOT, BILIDIR, IBILI No results found for: TSH  Medications  Outpatient Encounter Medications as of 08/15/2024  Medication Sig   CALCIUM-VITAMIN D-VITAMIN K PO Take by mouth.    cyanocobalamin (VITAMIN B12) 1000 MCG tablet Take 1,000 mcg by mouth daily.   diclofenac Sodium (VOLTAREN) 1 % GEL Apply 2 g topically 2 (two) times daily.   pantoprazole  (PROTONIX ) 40 MG tablet TAKE ONE TABLET BY MOUTH EVERY DAY.   Probiotic Product (PROBIOTIC PO) Take by mouth.   [DISCONTINUED] nitroGLYCERIN (NITRODUR - DOSED IN MG/24 HR) 0.1 mg/hr patch Place 0.1 mg onto the skin daily.   No facility-administered encounter medications on file as of 08/15/2024.     Assessment and Plan   Gastroesophageal reflux disease, unspecified whether esophagitis present       Continue Protonix  40 mg every day       Continue Dietary and behavior modification       Continue to follow regularly with PCP  Vitamin D deficiency Low vitamin D levels can be associated with adiposity and may result in leptin resistance and weight gain. Also associated with fatigue.  Check Vit D level at first visit   Class 2 obesity without serious comorbidity with body mass index (BMI) of 35.0 to 35.9 in adult, unspecified obesity type Obesity Treatment and Action Plan:  Patient will work on garnering support from family and friends to begin weight loss journey. Will work on eliminating or reducing the presence of highly palatable, calorie dense foods in the home. Will complete provided nutritional and psychosocial assessment questionnaire before the next appointment. Will be scheduled for indirect calorimetry to determine resting energy expenditure in a fasting state.  This will allow us  to create a reduced calorie, high-protein meal plan to promote loss of fat mass while preserving muscle mass. Counseled on the health benefits of losing 5%-15% of total body weight. Was counseled on nutritional approaches to weight loss and benefits of reducing processed foods and consuming plant-based foods and high quality protein as part of nutritional weight management. Was counseled on pharmacotherapy and role as an adjunct in  weight management.   Education and Additional resources  She was weighed on the bioimpedance scale and results were discussed and documented in the synopsis.  We discussed obesity as a progressive, chronic disease and the importance of a more detailed evaluation of all the factors contributing to the disease.  We reviewed the basic principles in obesity management.   We discussed the importance of long term lifestyle changes which include nutrition, exercise and behavioral modification as well as the importance of customizing this to her specific health and social needs.  We reviewed the role of medical interventions including pharmacotherapy and surgical interventions.   We discussed the benefits of reaching a healthier weight to alleviate the symptoms of existing conditions and reduce the risks of the biomechanical, cardiometabolic and psychological effects of obesity.  We reviewed our program approach and philosophy, which are guided by the four pillars of obesity medicine.  We discussed how to prepare for intake appointment and the importance of fasting and avoidance of stimulants for at least 8 hours prior to indirect calorimetry.  Zyniah Bradeen appears to be in the action stage of change and reports being ready to initiate intensive lifestyle and behavioral modifications as part of their weight loss journey.  Attestation  Reviewed by clinician on day of visit: allergies, medications, problem list, medical history, surgical history, family history, social history,  and previous encounter notes pertinent to obesity diagnosis.  I personally spent a total of 50 minutes in the care of the patient today including preparing to see the patient, getting/reviewing separately obtained history, performing a medically appropriate exam/evaluation, counseling and educating, and documenting clinical information in the EHR.   Lonell Liverpool ANP-C "

## 2024-09-04 ENCOUNTER — Ambulatory Visit (INDEPENDENT_AMBULATORY_CARE_PROVIDER_SITE_OTHER): Admitting: Nurse Practitioner

## 2024-09-04 ENCOUNTER — Encounter (INDEPENDENT_AMBULATORY_CARE_PROVIDER_SITE_OTHER): Payer: Self-pay | Admitting: Nurse Practitioner

## 2024-09-04 VITALS — BP 108/76 | HR 75 | Temp 97.6°F | Ht 63.0 in | Wt 191.0 lb

## 2024-09-04 DIAGNOSIS — E7800 Pure hypercholesterolemia, unspecified: Secondary | ICD-10-CM

## 2024-09-04 DIAGNOSIS — Z6833 Body mass index (BMI) 33.0-33.9, adult: Secondary | ICD-10-CM

## 2024-09-04 DIAGNOSIS — E66811 Obesity, class 1: Secondary | ICD-10-CM | POA: Diagnosis not present

## 2024-09-04 DIAGNOSIS — E559 Vitamin D deficiency, unspecified: Secondary | ICD-10-CM

## 2024-09-04 DIAGNOSIS — R5383 Other fatigue: Secondary | ICD-10-CM | POA: Diagnosis not present

## 2024-09-04 DIAGNOSIS — E785 Hyperlipidemia, unspecified: Secondary | ICD-10-CM

## 2024-09-04 DIAGNOSIS — R7303 Prediabetes: Secondary | ICD-10-CM | POA: Diagnosis not present

## 2024-09-04 DIAGNOSIS — K219 Gastro-esophageal reflux disease without esophagitis: Secondary | ICD-10-CM

## 2024-09-04 DIAGNOSIS — Z1331 Encounter for screening for depression: Secondary | ICD-10-CM | POA: Diagnosis not present

## 2024-09-04 DIAGNOSIS — R0602 Shortness of breath: Secondary | ICD-10-CM

## 2024-09-04 NOTE — Progress Notes (Addendum)
 " [address],  [place], [place] [address]  Office: [phone]  /  Fax: [phone]   Subjective   Initial Visit  Heather Ali (MR# [id number]) is a 69 y.o. female who presents for evaluation and treatment of obesity and related comorbidities. Current BMI is Body mass index is 33.83 kg/m. Heather Ali has been struggling with her weight for many years and has been unsuccessful in either losing weight, maintaining weight loss, or reaching her healthy weight goal.  Beautiful is currently in the action stage of change and ready to dedicate time achieving and maintaining a healthier weight. Heather Ali is interested in becoming our patient and working on intensive lifestyle modifications including (but not limited to) diet and exercise for weight loss.  [name] does have GERD and her symptoms are well controlled on Protonix  40 mg every day She retired 2 years ago.  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 She was on a cruise last week- she is down 1 pound from her consult visit. She did a lot of walking on the cruise.  Labs have previously shown pure hypercholesterolemia but has had a previous coronary artery calcium score which was 0 so not on statin therapy. Weight history:  When asked how their weight has affected their life and health, she states: Has affected self-esteem, Contributed to medical problems, Contributed to orthopedic problems or mobility issues, Having fatigue, and Having poor endurance  When asked what else they would like to accomplish? She states: Adopt a healthier eating pattern and lifestyle, Improve energy levels and physical activity, Improve existing medical conditions, Improve quality of life, Improve appearance, Improve self-confidence, and Lose 40 lbs  She starting to note weight gain during : adulthood.Heather Ali weighed in the 180's in her 46's. She lost a lot of weight with death of husband in October 01, 2010- weighed 157. Gradual increase since that time.    Life events associated with weight gain include : menopause.   Other contributing factors: family history of obesity, reduced physical activity, chronic skipping of meals, menopause, strong orexigenic signaling and/or inadequate inhibitory control , multiple weight loss attempts in the past, and self - critic or all-or-none mindset.  Their highest weight has been:  192 lbs.  Desired weight: 150  Previous weight-loss programs : Intermittent fasting and High Protein.  Their greatest challenge with dieting: dieting fatigue.  Current or previous pharmacotherapy: None.  Response to medication: Never tried medications   Nutritional History:  Current nutrition plan: High-protein.  How many times do you eat outside the home: 1-2 per week  How often do they skip meals: skips lunch occasionally  What beverages do they drink: water, caffeinated beverages , and protein shakes .   Use of artificial sweetners : Yes  Food intolerances or dislikes: tomatoes and some veggies.  Food triggers: Boredom and Seeking reward.  Food cravings: Sugary and Bread  Do they struggle with excessive hunger or portion control : No    Physical Activity:  Current level of physical activity: NEAT  Barriers to Exercise: orthopedic problems   Past medical history includes:   Past Medical History:  Diagnosis Date   Arthritis    Cataract    GERD (gastroesophageal reflux disease)    Heartburn      Objective   BP 108/76   Pulse 75   Temp 97.6 F (36.4 C)   Ht 5' 3 (1.6 m)   Wt 191 lb (86.6 kg)   SpO2 98%   BMI  33.83 kg/m  She was weighed on the bioimpedance scale: Body mass index is 33.83 kg/m.    Anthropometrics:  Vitals Temp: 97.6 F (36.4 C) BP: 108/76 Pulse Rate: 75 SpO2: 98 %   Anthropometric Measurements Height: 5' 3 (1.6 m) Weight: 191 lb (86.6 kg) BMI (Calculated): 33.84 Weight at Last Visit: 0Lb Weight Lost Since Last Visit: 0lb Weight Gained Since Last Visit:  0lb Starting Weight: 191lb Total Weight Loss (lbs): 0 lb (0 kg) Peak Weight: 192lb   Body Composition  Body Fat %: 44.5 % Fat Mass (lbs): 85.2 lbs Muscle Mass (lbs): 100.8 lbs Total Body Water (lbs): 70.6 lbs Visceral Fat Rating : 13   Other Clinical Data RMR: [id number] Fasting: Yes Labs: Yes Today's Visit #: 1 Starting Date: 09/04/24 Comments: First Visit    Physical Exam:  General: She is overweight, cooperative, alert, well developed, and in no acute distress. PSYCH: Has normal mood, affect and thought process.   HEENT: EOMI, sclerae are anicteric. Lungs: Normal breathing effort, no conversational dyspnea. Extremities: No edema.  Neurologic: No gross sensory or motor deficits. No tremors or fasciculations noted.    Diagnostic Data Reviewed  EKG: Normal sinus rhythm, rate 72. No conduction abnormalities, abnormal Q waves or chamber enlargement.  Indirect Calorimeter completed today shows a VO2 of 236 and a REE of 1627.  Her calculated basal metabolic rate is 8522 thus her resting energy expenditure faster than calculated.  Depression Screen  Yissel's PHQ-9 score was: 0.     09/04/2024    7:58 AM  Depression screen PHQ 2/9  Decreased Interest 0  Down, Depressed, Hopeless 0  PHQ - 2 Score 0  Altered sleeping 0  Tired, decreased energy 0  Change in appetite 0  Feeling bad or failure about yourself  0  Trouble concentrating 0  Moving slowly or fidgety/restless 0  Suicidal thoughts 0  PHQ-9 Score 0  Difficult doing work/chores Not difficult at all    Screening for Sleep Related Breathing Disorders  Corinthian admits to daytime somnolence and denies waking up still tired. Patient has a history of symptoms of daytime fatigue. Heather Ali generally gets 6 or 7 hours of sleep per night, and states that she has generally restful sleep. Snoring is not present. Apneic episodes are not present. Epworth Sleepiness Score is 8.   Labs drawn last at her PCP 04/16/24 Normal  electrolytes, glucose, liver enzymes, kidney functions, CBC, TSH, Vit D and urine microalbumin Elevated total cholesterol and LDL- 270/188 Elevation of J8r to prediabetic level at 5.8   Assessment and Plan  Class 1 obesity with serious comorbidity and body mass index (BMI) of 33.0 to 33.9 in adult, unspecified obesity type TREATMENT PLAN FOR OBESITY:  Recommended Dietary Goals  Tarshia is currently in the action stage of change. As such, her goal is to implement medically supervised obesity management plan.  She has agreed to implement: the Category 1 plan - 1000 kcal per day  Behavioral Intervention  We discussed the following Behavioral Modification Strategies today: increasing lean protein intake to established goals, decreasing simple carbohydrates , increasing vegetables, increasing lower glycemic fruits, increasing fiber rich foods, avoiding skipping meals, increasing water intake, work on meal planning and preparation, reading food labels , keeping healthy foods at home, identifying sources and decreasing liquid calories, decreasing eating out or consumption of processed foods, and making healthy choices when eating convenient foods, planning for success, and better snacking choices  Additional resources provided today: Handout on healthy eating and balanced  plate, Handout on complex carbohydrates and lean sources of protein, Category 1 packet, and Handout principles of weight management  Recommended Physical Activity Goals  Sindee has been advised to work up to 150 minutes of moderate intensity aerobic activity a week and strengthening exercises 2-3 times per week for cardiovascular health, weight loss maintenance and preservation of muscle mass.   She has agreed to :  Think about enjoyable ways to increase daily physical activity and overcoming barriers to exercise and Increase physical activity in their day and reduce sedentary time (increase NEAT).  Medical Interventions and  Pharmacotherapy We will work on building a therapist, art and behavioral strategies. We will discuss the role of pharmacotherapy as an adjunct at subsequent visits.   ASSOCIATED CONDITIONS ADDRESSED TODAY  Other Fatigue Tayna does feel that her weight is causing her energy to be lower than it should be. Fatigue may be related to obesity, depression or many other causes. Labs will be ordered, and in the meanwhile, Lizmarie will focus on self care including making healthy food choices, increasing physical activity and focusing on stress reduction. - EKG 12 lead  Shortness of Breath on exertion Deserea notes increasing shortness of breath with physical activity and seems to be worsening over time with weight gain. She notes getting out of breath sooner with activity than she used to. This has not gotten worse recently. Manie denies shortness of breath at rest or orthopnea.  Dyslipidemia Focus on implementing category 1 meal plan, limit saturated fats Loss of 10-15% body weight can improve lipid levels Continue to follow regularly with PCP  Prediabetes Start Category 1  meal plan, limit simple carbohydrates Decreasing body weight by 10-15% can improve glucose levels Continue to follow regularly with PCP  Vitamin D deficiency If Vit D level remains low will supplement with Ergocalciferol 50000 units once a week for 12 weeks and then recheck level.   -     VITAMIN D 25 Hydroxy (Vit-D Deficiency, Fractures)  Gastroesophageal reflux disease, unspecified whether esophagitis present Continue Protonix  40 mg every day and nutrition/behavior modifications Continue to follow regularly with PCP -     Magnesium  Depression screening - PHQ9 score of 0- no further intervention required  Class 1 obesity with serious comorbidity and body mass index (BMI) of 33.0 to 33.9 in adult, unspecified obesity type See plan above -     TSH -     Vitamin B12 -     VITAMIN D 25 Hydroxy (Vit-D  Deficiency, Fractures) -     Insulin, random -     Lipid panel -     Magnesium -     CBC with Differential/Platelet -     Comprehensive metabolic panel with GFR -     Hemoglobin A1c    Follow-up  She was informed of the importance of frequent follow-up visits to maximize her success with intensive lifestyle modifications for her multiple health conditions. She was informed we would discuss her lab results at her next visit unless there is a critical issue that needs to be addressed sooner. Leara agreed to keep her next visit at the agreed upon time to discuss these results.  Attestation Statement  This is the patient's intake visit at [facility]. The patient's Health Questionnaire was reviewed at length. Included in the packet: current and past health history, medications, allergies, ROS, gynecologic history (women only), surgical history, family history, social history, weight history, weight loss surgery history (for those that have  had weight loss surgery), nutritional evaluation, mood and food questionnaire, PHQ9, Epworth questionnaire, sleep habits questionnaire, patient life and health improvement goals questionnaire. These will all be scanned into the patient's chart under media.   During the visit, I independently reviewed the patient's EKG, previous labs, bioimpedance scale results, and indirect calorimetry results. I used this information to medically tailor a meal plan for the patient that will help her to lose weight and will improve her obesity-related conditions. I performed a medically necessary appropriate examination and/or evaluation. I discussed the assessment and treatment plan with the patient. The patient was provided an opportunity to ask questions and all were answered. The patient agreed with the plan and demonstrated an understanding of the instructions. Labs were ordered at this visit and will be reviewed at the next visit unless critical results need to  be addressed immediately. Clinical information was updated and documented in the EMR.   In addition, they received basic education on identification of processed foods and reduction of these, different sources of lean proteins and complex carbohydrates and how to eat balanced by incorporation of whole foods.  Reviewed by clinician on day of visit: allergies, medications, problem list, medical history, surgical history, family history, social history, and previous encounter notes.  I personally spent a total of 42 minutes in the care of the patient today including preparing to see the patient, getting/reviewing separately obtained history, performing a medically appropriate exam/evaluation, counseling and educating, placing orders, documenting clinical information in the EHR, and independently interpreting results. Not including time spent for EKG, indirect calorimetry or depression screening.   Lonell Liverpool ANP-C "

## 2024-09-05 ENCOUNTER — Other Ambulatory Visit (INDEPENDENT_AMBULATORY_CARE_PROVIDER_SITE_OTHER): Payer: Self-pay

## 2024-09-05 ENCOUNTER — Ambulatory Visit (INDEPENDENT_AMBULATORY_CARE_PROVIDER_SITE_OTHER): Payer: Self-pay | Admitting: Nurse Practitioner

## 2024-09-05 LAB — COMPREHENSIVE METABOLIC PANEL WITH GFR
ALT: 24 [IU]/L (ref 0–32)
AST: 16 [IU]/L (ref 0–40)
Albumin: 4.5 g/dL (ref 3.9–4.9)
Alkaline Phosphatase: 106 [IU]/L (ref 49–135)
BUN/Creatinine Ratio: 26 (ref 12–28)
BUN: 23 mg/dL (ref 8–27)
Bilirubin Total: 0.5 mg/dL (ref 0.0–1.2)
CO2: 24 mmol/L (ref 20–29)
Calcium: 9.9 mg/dL (ref 8.7–10.3)
Chloride: 102 mmol/L (ref 96–106)
Creatinine, Ser: 0.87 mg/dL (ref 0.57–1.00)
Globulin, Total: 2.4 g/dL (ref 1.5–4.5)
Glucose: 93 mg/dL (ref 70–99)
Potassium: 5 mmol/L (ref 3.5–5.2)
Sodium: 140 mmol/L (ref 134–144)
Total Protein: 6.9 g/dL (ref 6.0–8.5)
eGFR: 72 mL/min/{1.73_m2}

## 2024-09-05 LAB — CBC WITH DIFFERENTIAL/PLATELET
Basophils Absolute: 0.1 10*3/uL (ref 0.0–0.2)
Basos: 1 %
EOS (ABSOLUTE): 0.2 10*3/uL (ref 0.0–0.4)
Eos: 3 %
Hematocrit: 46.2 % (ref 34.0–46.6)
Hemoglobin: 15.2 g/dL (ref 11.1–15.9)
Immature Grans (Abs): 0 10*3/uL (ref 0.0–0.1)
Immature Granulocytes: 0 %
Lymphocytes Absolute: 1.6 10*3/uL (ref 0.7–3.1)
Lymphs: 25 %
MCH: 29.3 pg (ref 26.6–33.0)
MCHC: 32.9 g/dL (ref 31.5–35.7)
MCV: 89 fL (ref 79–97)
Monocytes Absolute: 0.4 10*3/uL (ref 0.1–0.9)
Monocytes: 7 %
Neutrophils Absolute: 4.1 10*3/uL (ref 1.4–7.0)
Neutrophils: 64 %
Platelets: 273 10*3/uL (ref 150–450)
RBC: 5.19 x10E6/uL (ref 3.77–5.28)
RDW: 12.7 % (ref 11.7–15.4)
WBC: 6.3 10*3/uL (ref 3.4–10.8)

## 2024-09-05 LAB — MAGNESIUM: Magnesium: 2.2 mg/dL (ref 1.6–2.3)

## 2024-09-05 LAB — HEMOGLOBIN A1C
Est. average glucose Bld gHb Est-mCnc: 120 mg/dL
Hgb A1c MFr Bld: 5.8 % — ABNORMAL HIGH (ref 4.8–5.6)

## 2024-09-05 LAB — VITAMIN B12: Vitamin B-12: 1773 pg/mL — ABNORMAL HIGH (ref 232–1245)

## 2024-09-05 LAB — VITAMIN D 25 HYDROXY (VIT D DEFICIENCY, FRACTURES): Vit D, 25-Hydroxy: 43 ng/mL (ref 30.0–100.0)

## 2024-09-05 LAB — TSH: TSH: 2.03 u[IU]/mL (ref 0.450–4.500)

## 2024-09-05 LAB — LIPID PANEL
Chol/HDL Ratio: 4.9 ratio — ABNORMAL HIGH (ref 0.0–4.4)
Cholesterol, Total: 281 mg/dL — ABNORMAL HIGH (ref 100–199)
HDL: 57 mg/dL
LDL Chol Calc (NIH): 193 mg/dL — ABNORMAL HIGH (ref 0–99)
Triglycerides: 167 mg/dL — ABNORMAL HIGH (ref 0–149)
VLDL Cholesterol Cal: 31 mg/dL (ref 5–40)

## 2024-09-05 LAB — INSULIN, RANDOM: INSULIN: 14.8 u[IU]/mL (ref 2.6–24.9)

## 2024-09-18 ENCOUNTER — Ambulatory Visit (INDEPENDENT_AMBULATORY_CARE_PROVIDER_SITE_OTHER): Admitting: Nurse Practitioner
